# Patient Record
Sex: Female | Born: 1955 | Race: Black or African American | Hispanic: No | Marital: Married | Smoking: Former smoker
Health system: Southern US, Community
[De-identification: ages and names within clinical notes are randomized; demographics above are authoritative.]

## PROBLEM LIST (undated history)

## (undated) DIAGNOSIS — J45909 Unspecified asthma, uncomplicated: Secondary | ICD-10-CM

## (undated) DIAGNOSIS — I10 Essential (primary) hypertension: Secondary | ICD-10-CM

---

## 1998-10-30 ENCOUNTER — Other Ambulatory Visit: Admission: RE | Admit: 1998-10-30 | Discharge: 1998-10-30 | Payer: Self-pay | Admitting: Obstetrics

## 1999-02-28 ENCOUNTER — Encounter: Payer: Self-pay | Admitting: Emergency Medicine

## 1999-02-28 ENCOUNTER — Emergency Department (HOSPITAL_COMMUNITY): Admission: EM | Admit: 1999-02-28 | Discharge: 1999-02-28 | Payer: Self-pay | Admitting: Emergency Medicine

## 1999-04-12 ENCOUNTER — Encounter: Payer: Self-pay | Admitting: Emergency Medicine

## 1999-04-12 ENCOUNTER — Emergency Department (HOSPITAL_COMMUNITY): Admission: EM | Admit: 1999-04-12 | Discharge: 1999-04-12 | Payer: Self-pay | Admitting: Emergency Medicine

## 1999-04-30 ENCOUNTER — Emergency Department (HOSPITAL_COMMUNITY): Admission: EM | Admit: 1999-04-30 | Discharge: 1999-04-30 | Payer: Self-pay | Admitting: Emergency Medicine

## 1999-04-30 ENCOUNTER — Encounter: Payer: Self-pay | Admitting: Emergency Medicine

## 2000-10-04 ENCOUNTER — Emergency Department (HOSPITAL_COMMUNITY): Admission: EM | Admit: 2000-10-04 | Discharge: 2000-10-05 | Payer: Self-pay | Admitting: Emergency Medicine

## 2001-01-28 ENCOUNTER — Encounter: Payer: Self-pay | Admitting: Obstetrics

## 2001-01-28 ENCOUNTER — Ambulatory Visit (HOSPITAL_COMMUNITY): Admission: RE | Admit: 2001-01-28 | Discharge: 2001-01-28 | Payer: Self-pay | Admitting: Obstetrics

## 2001-05-25 ENCOUNTER — Emergency Department (HOSPITAL_COMMUNITY): Admission: EM | Admit: 2001-05-25 | Discharge: 2001-05-25 | Payer: Self-pay | Admitting: Emergency Medicine

## 2002-11-03 ENCOUNTER — Encounter: Payer: Self-pay | Admitting: Obstetrics

## 2002-11-03 ENCOUNTER — Ambulatory Visit (HOSPITAL_COMMUNITY): Admission: RE | Admit: 2002-11-03 | Discharge: 2002-11-03 | Payer: Self-pay | Admitting: Obstetrics

## 2004-03-29 ENCOUNTER — Ambulatory Visit (HOSPITAL_COMMUNITY): Admission: RE | Admit: 2004-03-29 | Discharge: 2004-03-29 | Payer: Self-pay | Admitting: Obstetrics

## 2005-06-24 ENCOUNTER — Ambulatory Visit (HOSPITAL_COMMUNITY): Admission: RE | Admit: 2005-06-24 | Discharge: 2005-06-24 | Payer: Self-pay | Admitting: Obstetrics

## 2006-07-29 ENCOUNTER — Ambulatory Visit (HOSPITAL_COMMUNITY): Admission: RE | Admit: 2006-07-29 | Discharge: 2006-07-29 | Payer: Self-pay | Admitting: Obstetrics

## 2006-08-14 ENCOUNTER — Encounter: Admission: RE | Admit: 2006-08-14 | Discharge: 2006-08-14 | Payer: Self-pay | Admitting: Obstetrics

## 2007-05-17 ENCOUNTER — Encounter: Admission: RE | Admit: 2007-05-17 | Discharge: 2007-05-17 | Payer: Self-pay | Admitting: Obstetrics

## 2007-12-16 ENCOUNTER — Ambulatory Visit (HOSPITAL_BASED_OUTPATIENT_CLINIC_OR_DEPARTMENT_OTHER): Admission: RE | Admit: 2007-12-16 | Discharge: 2007-12-16 | Payer: Self-pay | Admitting: Family Medicine

## 2008-09-10 ENCOUNTER — Emergency Department (HOSPITAL_BASED_OUTPATIENT_CLINIC_OR_DEPARTMENT_OTHER): Admission: EM | Admit: 2008-09-10 | Discharge: 2008-09-10 | Payer: Self-pay | Admitting: Emergency Medicine

## 2009-05-23 ENCOUNTER — Ambulatory Visit (HOSPITAL_COMMUNITY): Admission: RE | Admit: 2009-05-23 | Discharge: 2009-05-23 | Payer: Self-pay | Admitting: Obstetrics

## 2010-06-30 ENCOUNTER — Encounter: Payer: Self-pay | Admitting: Obstetrics

## 2010-07-03 ENCOUNTER — Emergency Department (HOSPITAL_BASED_OUTPATIENT_CLINIC_OR_DEPARTMENT_OTHER)
Admission: EM | Admit: 2010-07-03 | Discharge: 2010-07-03 | Payer: Self-pay | Source: Home / Self Care | Admitting: Emergency Medicine

## 2011-03-05 ENCOUNTER — Other Ambulatory Visit (HOSPITAL_COMMUNITY): Payer: Self-pay | Admitting: Family Medicine

## 2011-03-05 DIAGNOSIS — Z1231 Encounter for screening mammogram for malignant neoplasm of breast: Secondary | ICD-10-CM

## 2011-03-13 ENCOUNTER — Ambulatory Visit (HOSPITAL_COMMUNITY): Payer: PRIVATE HEALTH INSURANCE | Attending: Family Medicine

## 2011-09-24 ENCOUNTER — Ambulatory Visit (HOSPITAL_COMMUNITY)
Admission: RE | Admit: 2011-09-24 | Discharge: 2011-09-24 | Disposition: A | Discharge status: Home / Self Care | Payer: PRIVATE HEALTH INSURANCE | Source: Ambulatory Visit | Attending: Family Medicine | Admitting: Family Medicine

## 2011-09-24 DIAGNOSIS — Z1231 Encounter for screening mammogram for malignant neoplasm of breast: Secondary | ICD-10-CM

## 2012-07-31 ENCOUNTER — Encounter (HOSPITAL_BASED_OUTPATIENT_CLINIC_OR_DEPARTMENT_OTHER): Payer: Self-pay | Admitting: *Deleted

## 2012-07-31 ENCOUNTER — Emergency Department (HOSPITAL_BASED_OUTPATIENT_CLINIC_OR_DEPARTMENT_OTHER)
Admission: EM | Admit: 2012-07-31 | Discharge: 2012-07-31 | Disposition: A | Discharge status: Home / Self Care | Payer: Self-pay | Attending: Emergency Medicine | Admitting: Emergency Medicine

## 2012-07-31 DIAGNOSIS — J45901 Unspecified asthma with (acute) exacerbation: Secondary | ICD-10-CM | POA: Insufficient documentation

## 2012-07-31 DIAGNOSIS — R1110 Vomiting, unspecified: Secondary | ICD-10-CM | POA: Insufficient documentation

## 2012-07-31 DIAGNOSIS — I10 Essential (primary) hypertension: Secondary | ICD-10-CM | POA: Insufficient documentation

## 2012-07-31 DIAGNOSIS — Z79899 Other long term (current) drug therapy: Secondary | ICD-10-CM | POA: Insufficient documentation

## 2012-07-31 DIAGNOSIS — Z87891 Personal history of nicotine dependence: Secondary | ICD-10-CM | POA: Insufficient documentation

## 2012-07-31 DIAGNOSIS — R059 Cough, unspecified: Secondary | ICD-10-CM | POA: Insufficient documentation

## 2012-07-31 HISTORY — DX: Unspecified asthma, uncomplicated: J45.909

## 2012-07-31 HISTORY — DX: Essential (primary) hypertension: I10

## 2012-07-31 MED ORDER — IPRATROPIUM BROMIDE 0.02 % IN SOLN
RESPIRATORY_TRACT | Status: AC
  Filled 2012-07-31: qty 2.5

## 2012-07-31 MED ORDER — METHYLPREDNISOLONE SODIUM SUCC 125 MG IJ SOLR
125.0000 mg | Freq: Once | INTRAMUSCULAR | Status: AC
  Administered 2012-07-31: 125 mg via INTRAVENOUS
  Filled 2012-07-31: qty 2

## 2012-07-31 MED ORDER — ALBUTEROL SULFATE (5 MG/ML) 0.5% IN NEBU
5.0000 mg | INHALATION_SOLUTION | Freq: Once | RESPIRATORY_TRACT | Status: AC
  Administered 2012-07-31: 5 mg via RESPIRATORY_TRACT

## 2012-07-31 MED ORDER — ALBUTEROL SULFATE HFA 108 (90 BASE) MCG/ACT IN AERS
2.0000 | INHALATION_SPRAY | Freq: Once | RESPIRATORY_TRACT | Status: AC
  Administered 2012-07-31: 2 via RESPIRATORY_TRACT
  Filled 2012-07-31: qty 6.7

## 2012-07-31 MED ORDER — ALBUTEROL SULFATE (5 MG/ML) 0.5% IN NEBU
INHALATION_SOLUTION | RESPIRATORY_TRACT | Status: AC
  Filled 2012-07-31: qty 1

## 2012-07-31 MED ORDER — IPRATROPIUM BROMIDE 0.02 % IN SOLN
0.5000 mg | Freq: Once | RESPIRATORY_TRACT | Status: AC
  Administered 2012-07-31: 0.5 mg via RESPIRATORY_TRACT

## 2012-07-31 NOTE — ED Provider Notes (Signed)
History     CSN: [id number]  Arrival date & time 07/31/12  0201   First MD Initiated Contact with Patient 07/31/12 0244      Chief Complaint  Patient presents with  . Asthma    (Consider location/radiation/quality/duration/timing/severity/associated sxs/prior treatment) HPI This is a 56 year old female with a history of asthma. She states she cleaned out her shower with Clorox 2 days ago and this triggered her asthma. Her shortness of breath worsened and subsequently into a became severe this morning. It was associated with wheezing, cough and posttussive emesis. She denies fever. States she could not find her albuterol inhaler despite searching for house for her. She was given albuterol and Atrovent treatment per protocol on arrival with significant improvement in her symptomatology. She states she feels back to baseline at this time.  Past Medical History  Diagnosis Date  . Asthma   . Hypertension     Past Surgical History  Procedure Laterality Date  . Cesarean section      No family history on file.  History  Substance Use Topics  . Smoking status: Former Games developer  . Smokeless tobacco: Never Used  . Alcohol Use: Not on file    OB History   Grav Para Term Preterm Abortions TAB SAB Ect Mult Living                  Review of Systems  All other systems reviewed and are negative.    Allergies  Penicillins  Home Medications   Current Outpatient Rx  Name  Route  Sig  Dispense  Refill  . albuterol (PROVENTIL HFA;VENTOLIN HFA) 108 (90 BASE) MCG/ACT inhaler   Inhalation   Inhale 2 puffs into the lungs every 6 (six) hours as needed for wheezing.           BP 139/96  Pulse 90  Temp(Src) 98.1 F (36.7 C)  SpO2 95%  Physical Exam General: Well-developed, well-nourished female in no acute distress; appearance consistent with age of record HENT: normocephalic, atraumatic Eyes: pupils equal round and reactive to light; extraocular muscles intact Neck:  supple Heart: regular rate and rhythm Lungs: clear to auscultation bilaterally Abdomen: soft; nondistended Extremities: No deformity; full range of motion Neurologic: Awake, alert and oriented; motor function intact in all extremities and symmetric; no facial droop Skin: Warm and dry Psychiatric: Normal mood and affect    ED Course  Procedures (including critical care time)     MDM  3:39 AM Lungs still clear. We will provide a replacement inhaler.        Hanley Seamen, MD 07/31/12 [phone]

## 2012-07-31 NOTE — Patient Instructions (Signed)
Instructed pt on the proper use of administering albuteral mdi via aerochamber pt tolerated well 

## 2012-07-31 NOTE — ED Notes (Signed)
C/o sob since Thursday- hx of asthma

## 2013-08-18 ENCOUNTER — Other Ambulatory Visit (HOSPITAL_COMMUNITY): Payer: Self-pay | Admitting: Family Medicine

## 2013-08-18 DIAGNOSIS — Z1231 Encounter for screening mammogram for malignant neoplasm of breast: Secondary | ICD-10-CM

## 2013-08-30 ENCOUNTER — Ambulatory Visit (HOSPITAL_COMMUNITY): Payer: PRIVATE HEALTH INSURANCE

## 2013-09-06 ENCOUNTER — Ambulatory Visit (HOSPITAL_COMMUNITY)
Admission: RE | Admit: 2013-09-06 | Discharge: 2013-09-06 | Disposition: A | Discharge status: Home / Self Care | Payer: PRIVATE HEALTH INSURANCE | Source: Ambulatory Visit | Attending: Family Medicine | Admitting: Family Medicine

## 2013-09-06 DIAGNOSIS — Z1231 Encounter for screening mammogram for malignant neoplasm of breast: Secondary | ICD-10-CM | POA: Insufficient documentation

## 2015-09-03 LAB — PROCEDURE REPORT - SCANNED: PAP SMEAR: NEGATIVE

## 2015-09-18 ENCOUNTER — Other Ambulatory Visit: Payer: Self-pay

## 2015-09-18 DIAGNOSIS — Z1231 Encounter for screening mammogram for malignant neoplasm of breast: Secondary | ICD-10-CM

## 2015-10-05 ENCOUNTER — Ambulatory Visit: Payer: PRIVATE HEALTH INSURANCE

## 2015-10-25 ENCOUNTER — Ambulatory Visit: Payer: PRIVATE HEALTH INSURANCE

## 2015-11-02 ENCOUNTER — Ambulatory Visit: Payer: PRIVATE HEALTH INSURANCE

## 2015-11-14 ENCOUNTER — Ambulatory Visit: Payer: PRIVATE HEALTH INSURANCE

## 2015-11-23 ENCOUNTER — Ambulatory Visit
Admission: RE | Admit: 2015-11-23 | Discharge: 2015-11-23 | Disposition: A | Discharge status: Home / Self Care | Payer: PRIVATE HEALTH INSURANCE | Source: Ambulatory Visit

## 2015-11-23 DIAGNOSIS — Z1231 Encounter for screening mammogram for malignant neoplasm of breast: Secondary | ICD-10-CM

## 2016-03-02 ENCOUNTER — Encounter: Payer: Self-pay | Admitting: *Deleted

## 2017-03-13 ENCOUNTER — Other Ambulatory Visit: Payer: Self-pay | Admitting: Family Medicine

## 2017-03-13 ENCOUNTER — Ambulatory Visit
Admission: RE | Admit: 2017-03-13 | Discharge: 2017-03-13 | Disposition: A | Discharge status: Home / Self Care | Payer: PRIVATE HEALTH INSURANCE | Source: Ambulatory Visit | Attending: Family Medicine | Admitting: Family Medicine

## 2017-03-13 DIAGNOSIS — M130 Polyarthritis, unspecified: Secondary | ICD-10-CM

## 2017-09-14 DIAGNOSIS — Z87891 Personal history of nicotine dependence: Secondary | ICD-10-CM | POA: Insufficient documentation

## 2017-09-14 DIAGNOSIS — R0602 Shortness of breath: Secondary | ICD-10-CM | POA: Diagnosis present

## 2017-09-14 DIAGNOSIS — J4521 Mild intermittent asthma with (acute) exacerbation: Secondary | ICD-10-CM | POA: Diagnosis not present

## 2017-09-14 DIAGNOSIS — I10 Essential (primary) hypertension: Secondary | ICD-10-CM | POA: Insufficient documentation

## 2017-09-14 DIAGNOSIS — Z79899 Other long term (current) drug therapy: Secondary | ICD-10-CM | POA: Insufficient documentation

## 2017-09-15 ENCOUNTER — Other Ambulatory Visit: Payer: Self-pay

## 2017-09-15 ENCOUNTER — Encounter (HOSPITAL_BASED_OUTPATIENT_CLINIC_OR_DEPARTMENT_OTHER): Payer: Self-pay | Admitting: *Deleted

## 2017-09-15 ENCOUNTER — Emergency Department (HOSPITAL_BASED_OUTPATIENT_CLINIC_OR_DEPARTMENT_OTHER): Payer: PRIVATE HEALTH INSURANCE

## 2017-09-15 ENCOUNTER — Emergency Department (HOSPITAL_BASED_OUTPATIENT_CLINIC_OR_DEPARTMENT_OTHER)
Admission: EM | Admit: 2017-09-15 | Discharge: 2017-09-15 | Disposition: A | Discharge status: Home / Self Care | Payer: PRIVATE HEALTH INSURANCE | Attending: Emergency Medicine | Admitting: Emergency Medicine

## 2017-09-15 DIAGNOSIS — J4521 Mild intermittent asthma with (acute) exacerbation: Secondary | ICD-10-CM

## 2017-09-15 MED ORDER — PREDNISONE 20 MG PO TABS: 40.0000 mg | ORAL_TABLET | Freq: Every day | ORAL | 0 refills | Status: AC

## 2017-09-15 MED ORDER — ALBUTEROL SULFATE HFA 108 (90 BASE) MCG/ACT IN AERS: 2.0000 | INHALATION_SPRAY | RESPIRATORY_TRACT | 2 refills | Status: AC | PRN

## 2017-09-15 MED ORDER — IPRATROPIUM-ALBUTEROL 0.5-2.5 (3) MG/3ML IN SOLN
RESPIRATORY_TRACT | Status: AC
  Administered 2017-09-15: 3 mL via RESPIRATORY_TRACT
  Filled 2017-09-15: qty 3

## 2017-09-15 MED ORDER — ALBUTEROL (5 MG/ML) CONTINUOUS INHALATION SOLN
10.0000 mg/h | INHALATION_SOLUTION | RESPIRATORY_TRACT | Status: DC
  Administered 2017-09-15: 10 mg/h via RESPIRATORY_TRACT
  Filled 2017-09-15: qty 20

## 2017-09-15 MED ORDER — ALBUTEROL SULFATE HFA 108 (90 BASE) MCG/ACT IN AERS
2.0000 | INHALATION_SPRAY | RESPIRATORY_TRACT | Status: DC | PRN
  Administered 2017-09-15: 2 via RESPIRATORY_TRACT
  Filled 2017-09-15: qty 6.7

## 2017-09-15 MED ORDER — ALBUTEROL SULFATE (2.5 MG/3ML) 0.083% IN NEBU
INHALATION_SOLUTION | RESPIRATORY_TRACT | Status: AC
  Administered 2017-09-15: 2.5 mg via RESPIRATORY_TRACT
  Filled 2017-09-15: qty 3

## 2017-09-15 MED ORDER — IPRATROPIUM-ALBUTEROL 0.5-2.5 (3) MG/3ML IN SOLN
3.0000 mL | Freq: Once | RESPIRATORY_TRACT | Status: AC
  Administered 2017-09-15: 3 mL via RESPIRATORY_TRACT

## 2017-09-15 MED ORDER — ALBUTEROL SULFATE (2.5 MG/3ML) 0.083% IN NEBU
2.5000 mg | INHALATION_SOLUTION | Freq: Once | RESPIRATORY_TRACT | Status: AC
  Administered 2017-09-15: 2.5 mg via RESPIRATORY_TRACT

## 2017-09-15 MED ORDER — PREDNISONE 50 MG PO TABS
60.0000 mg | ORAL_TABLET | Freq: Once | ORAL | Status: AC
  Administered 2017-09-15: 02:00:00 60 mg via ORAL
  Filled 2017-09-15: qty 1

## 2017-09-15 NOTE — ED Notes (Signed)
Family at bedside. 

## 2017-09-15 NOTE — ED Triage Notes (Signed)
Pt reports onset of SOB tonight. She states she is out of her inhalers at home. Denies fevers.

## 2017-09-15 NOTE — ED Provider Notes (Signed)
[facility] EMERGENCY DEPARTMENT Provider Note   CSN: [id number] Arrival date & time: 09/14/17  2358     History   Chief Complaint Chief Complaint  Patient presents with  . Shortness of Breath    HPI Chelsea Garza is a 62 y.o. female.  Patient presents to the emergency department for evaluation of difficulty breathing.  Patient has a history of asthma.  She reports that she has been feeling some wheezing and shortness of breath the last few days with the weather change in pollen.  Tonight, however, she had acute onset of wheezing.  She did not have any albuterol at home so she presented for evaluation.  Patient denies any fever, cough, chest congestion, chest pain.     Past Medical History:  Diagnosis Date  . Asthma   . Hypertension     There are no active problems to display for this patient.   Past Surgical History:  Procedure Laterality Date  . CESAREAN SECTION       OB History   None      Home Medications    Prior to Admission medications   Medication Sig Start Date End Date Taking? Authorizing Provider  albuterol (PROVENTIL HFA;VENTOLIN HFA) 108 (90 BASE) MCG/ACT inhaler Inhale 2 puffs into the lungs every 6 (six) hours as needed for wheezing.   Yes [provider]  budesonide-formoterol (SYMBICORT) 160-4.5 MCG/ACT inhaler Inhale 2 puffs into the lungs 2 (two) times daily.   Yes [provider]  amLODipine (NORVASC) 10 MG tablet  08/22/17   [provider]  cyclobenzaprine (FLEXERIL) 10 MG tablet  09/10/17   [provider]    Family History No family history on file.  Social History Social History   Tobacco Use  . Smoking status: Former Games developermoker  . Smokeless tobacco: Never Used  Substance Use Topics  . Alcohol use: Not on file  . Drug use: No     Allergies   Penicillins   Review of Systems Review of Systems  Respiratory: Positive for shortness of breath and wheezing.   All other systems reviewed  and are negative.    Physical Exam Updated Vital Signs BP 128/84   Pulse 94   Temp 98.1 F (36.7 C) (Oral)   Resp (!) 21   Ht 5\' 7"  (1.702 m)   Wt 99.3 kg (219 lb)   SpO2 96%   BMI 34.30 kg/m   Physical Exam  Constitutional: She is oriented to person, place, and time. She appears well-developed and well-nourished. No distress.  HENT:  Head: Normocephalic and atraumatic.  Right Ear: Hearing normal.  Left Ear: Hearing normal.  Nose: Nose normal.  Mouth/Throat: Oropharynx is clear and moist and mucous membranes are normal.  Eyes: Pupils are equal, round, and reactive to light. Conjunctivae and EOM are normal.  Neck: Normal range of motion. Neck supple.  Cardiovascular: Regular rhythm, S1 normal and S2 normal. Exam reveals no gallop and no friction rub.  No murmur heard. Pulmonary/Chest: Effort normal. No respiratory distress. She has decreased breath sounds. She has wheezes. She exhibits no tenderness.  Abdominal: Soft. Normal appearance and bowel sounds are normal. There is no hepatosplenomegaly. There is no tenderness. There is no rebound, no guarding, no tenderness at McBurney's point and negative Murphy's sign. No hernia.  Musculoskeletal: Normal range of motion.  Neurological: She is alert and oriented to person, place, and time. She has normal strength. No cranial nerve deficit or sensory deficit. Coordination normal. GCS eye subscore  is 4. GCS verbal subscore is 5. GCS motor subscore is 6.  Skin: Skin is warm, dry and intact. No rash noted. No cyanosis.  Psychiatric: She has a normal mood and affect. Her speech is normal and behavior is normal. Thought content normal.  Nursing note and vitals reviewed.    ED Treatments / Results  Labs (all labs ordered are listed, but only abnormal results are displayed) Labs Reviewed - No data to display  EKG None  Radiology No results found.  Procedures Procedures (including critical care time)  Medications Ordered in  ED Medications  albuterol (PROVENTIL,VENTOLIN) solution continuous neb (0 mg/hr Nebulization Stopped 09/15/17 0124)  albuterol (PROVENTIL HFA;VENTOLIN HFA) 108 (90 Base) MCG/ACT inhaler 2 puff (has no administration in time range)  predniSONE (DELTASONE) tablet 60 mg (has no administration in time range)  ipratropium-albuterol (DUONEB) 0.5-2.5 (3) MG/3ML nebulizer solution 3 mL (3 mLs Nebulization Given 09/15/17 0023)  albuterol (PROVENTIL) (2.5 MG/3ML) 0.083% nebulizer solution 2.5 mg (2.5 mg Nebulization Given 09/15/17 0023)     Initial Impression / Assessment and Plan / ED Course  I have reviewed the triage vital signs and the nursing notes.  Pertinent labs & imaging results that were available during my care of the patient were reviewed by me and considered in my medical decision making (see chart for details).     Patient with previous history of asthma presents to the emergency department for evaluation of wheezing and shortness of breath.  Symptoms began this evening.  She did not have an albuterol rescue inhaler to use at home.  Patient had moderate bronchospasm on arrival to the ER.  She has been treated with albuterol and Atrovent and has had complete resolution.  Oxygenation is 96% on room air, she is moving air well without any further wheezing.  Respiratory rate is normal.  She has had significant improvement and is appropriate for continued outpatient management.  Final Clinical Impressions(s) / ED Diagnoses   Final diagnoses:  Mild intermittent asthma with exacerbation    ED Discharge Orders    None       Jeriyah Granlund, Canary Brim, MD 09/15/17 0128

## 2017-09-15 NOTE — ED Notes (Signed)
Pt. Stated she was using her vacuum tonight and her back got really tight and her chest got real tight.  Pt. Said she could not stand up straight and she could not take a deep breath.  Pt. Came to ED.

## 2018-08-18 ENCOUNTER — Other Ambulatory Visit (HOSPITAL_COMMUNITY)
Admission: RE | Admit: 2018-08-18 | Discharge: 2018-08-18 | Disposition: A | Discharge status: Home / Self Care | Payer: PRIVATE HEALTH INSURANCE | Source: Ambulatory Visit | Attending: Nurse Practitioner | Admitting: Nurse Practitioner

## 2018-08-18 DIAGNOSIS — Z124 Encounter for screening for malignant neoplasm of cervix: Secondary | ICD-10-CM | POA: Insufficient documentation

## 2018-08-19 ENCOUNTER — Other Ambulatory Visit: Payer: Self-pay

## 2018-08-24 LAB — CYTOLOGY - PAP
Diagnosis: NEGATIVE
HPV (WINDOPATH): NOT DETECTED

## 2019-02-13 IMAGING — DX DG CHEST 1V PORT
1 series · 1 of 1 positions shown · non-contrast
Comparison: None.

CLINICAL DATA: Shortness of breath.

EXAM:
PORTABLE CHEST 1 VIEW

[chest ap]
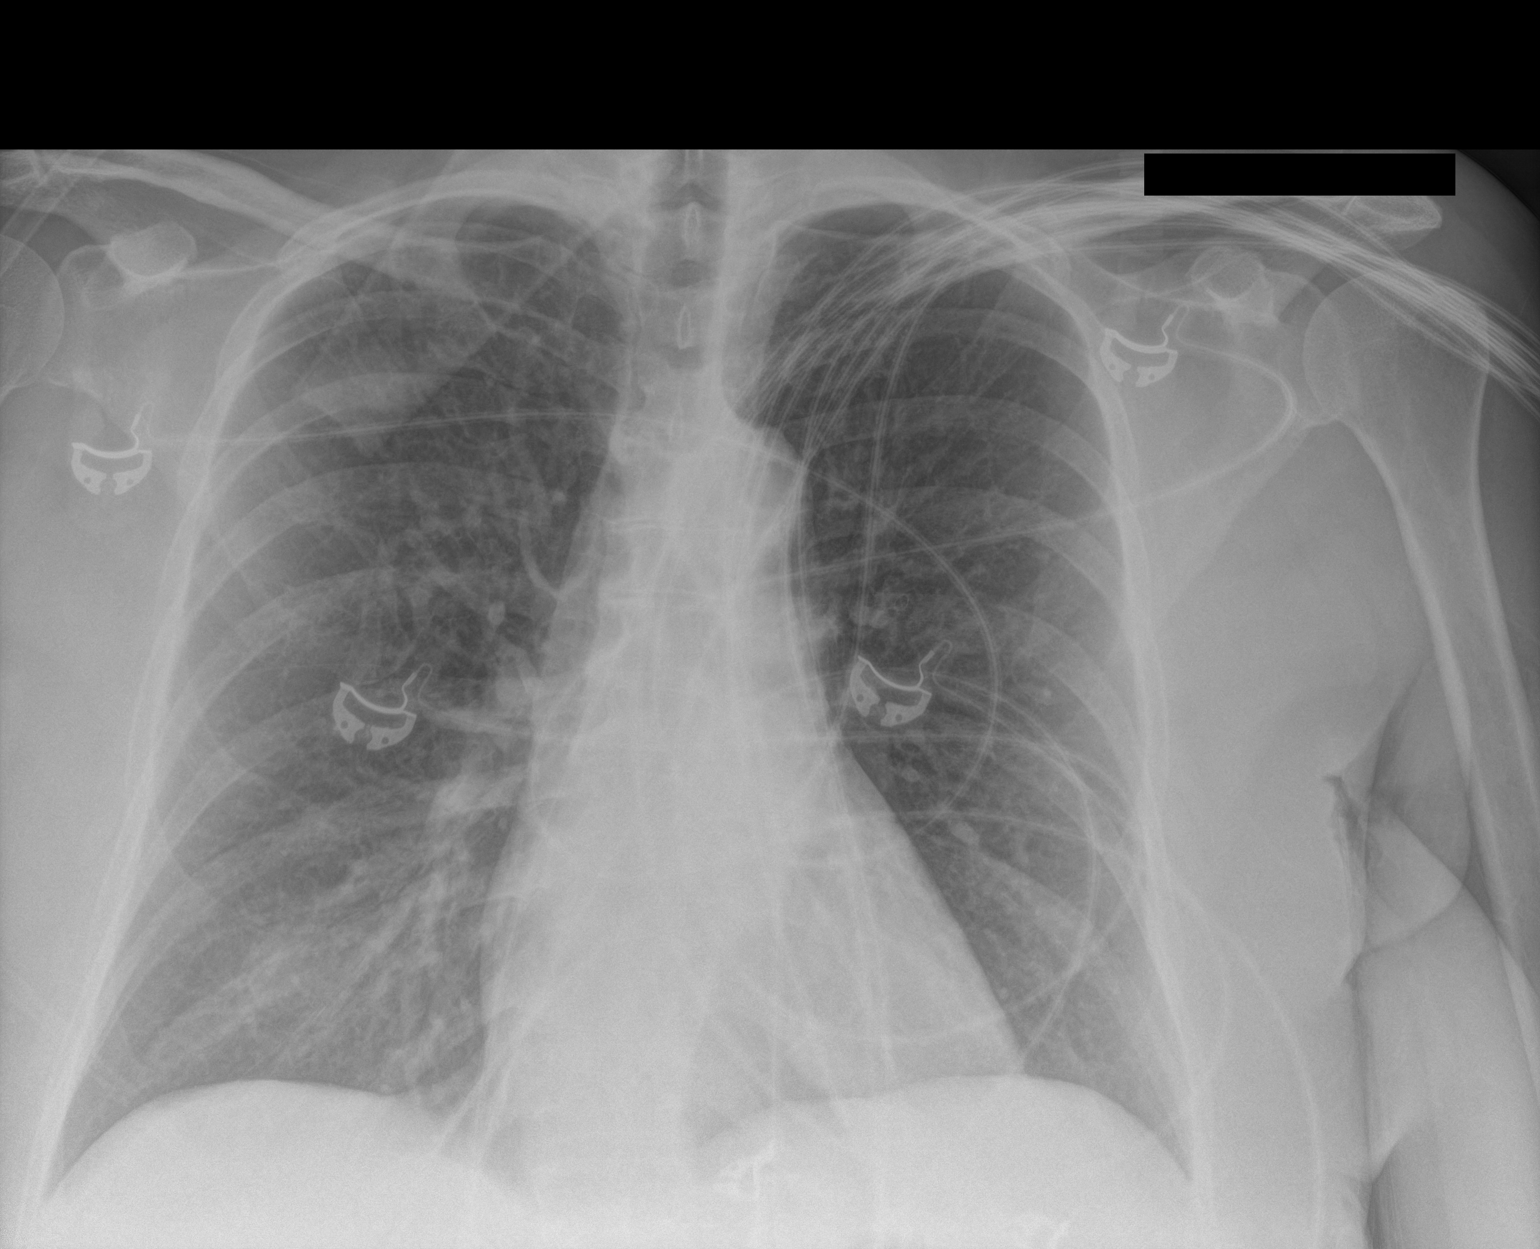

[1 of 1 positions shown; findings below may reference images not displayed]

FINDINGS: The cardiomediastinal contours are normal. Borderline hyperinflation
and bronchial thickening. Mild right lung base atelectasis.
Pulmonary vasculature is normal. No consolidation, pleural effusion,
or pneumothorax. No acute osseous abnormalities are seen.
IMPRESSION: Borderline hyperinflation and bronchial thickening, which can be
seen with asthma. Mild right lung base atelectasis.

## 2019-03-15 DIAGNOSIS — E782 Mixed hyperlipidemia: Secondary | ICD-10-CM | POA: Diagnosis not present

## 2019-03-15 DIAGNOSIS — F5102 Adjustment insomnia: Secondary | ICD-10-CM | POA: Diagnosis not present

## 2019-03-15 DIAGNOSIS — G5693 Unspecified mononeuropathy of bilateral upper limbs: Secondary | ICD-10-CM | POA: Diagnosis not present

## 2019-03-15 DIAGNOSIS — R7309 Other abnormal glucose: Secondary | ICD-10-CM | POA: Diagnosis not present

## 2019-03-15 DIAGNOSIS — I10 Essential (primary) hypertension: Secondary | ICD-10-CM | POA: Diagnosis not present

## 2019-03-15 DIAGNOSIS — M130 Polyarthritis, unspecified: Secondary | ICD-10-CM | POA: Diagnosis not present

## 2019-03-15 DIAGNOSIS — J441 Chronic obstructive pulmonary disease with (acute) exacerbation: Secondary | ICD-10-CM | POA: Diagnosis not present

## 2019-04-19 DIAGNOSIS — M130 Polyarthritis, unspecified: Secondary | ICD-10-CM | POA: Diagnosis not present

## 2019-04-21 DIAGNOSIS — M545 Low back pain: Secondary | ICD-10-CM | POA: Diagnosis not present

## 2019-04-21 DIAGNOSIS — M25551 Pain in right hip: Secondary | ICD-10-CM | POA: Diagnosis not present

## 2019-04-21 DIAGNOSIS — M79642 Pain in left hand: Secondary | ICD-10-CM | POA: Diagnosis not present

## 2019-09-27 DIAGNOSIS — E669 Obesity, unspecified: Secondary | ICD-10-CM | POA: Diagnosis not present

## 2019-09-27 DIAGNOSIS — J441 Chronic obstructive pulmonary disease with (acute) exacerbation: Secondary | ICD-10-CM | POA: Diagnosis not present

## 2019-09-27 DIAGNOSIS — R7303 Prediabetes: Secondary | ICD-10-CM | POA: Diagnosis not present

## 2019-09-27 DIAGNOSIS — I10 Essential (primary) hypertension: Secondary | ICD-10-CM | POA: Diagnosis not present

## 2019-09-27 DIAGNOSIS — E782 Mixed hyperlipidemia: Secondary | ICD-10-CM | POA: Diagnosis not present

## 2019-10-20 DIAGNOSIS — B351 Tinea unguium: Secondary | ICD-10-CM | POA: Diagnosis not present

## 2019-10-20 DIAGNOSIS — L718 Other rosacea: Secondary | ICD-10-CM | POA: Diagnosis not present

## 2019-10-20 DIAGNOSIS — L308 Other specified dermatitis: Secondary | ICD-10-CM | POA: Diagnosis not present

## 2019-11-03 DIAGNOSIS — Z780 Asymptomatic menopausal state: Secondary | ICD-10-CM | POA: Diagnosis not present

## 2019-11-03 DIAGNOSIS — Z1231 Encounter for screening mammogram for malignant neoplasm of breast: Secondary | ICD-10-CM | POA: Diagnosis not present

## 2019-12-07 DIAGNOSIS — E7849 Other hyperlipidemia: Secondary | ICD-10-CM | POA: Diagnosis not present

## 2019-12-07 DIAGNOSIS — Z720 Tobacco use: Secondary | ICD-10-CM | POA: Diagnosis not present

## 2019-12-07 DIAGNOSIS — I10 Essential (primary) hypertension: Secondary | ICD-10-CM | POA: Diagnosis not present

## 2019-12-07 DIAGNOSIS — J441 Chronic obstructive pulmonary disease with (acute) exacerbation: Secondary | ICD-10-CM | POA: Diagnosis not present

## 2019-12-27 DIAGNOSIS — R10816 Epigastric abdominal tenderness: Secondary | ICD-10-CM | POA: Diagnosis not present

## 2019-12-27 DIAGNOSIS — E6609 Other obesity due to excess calories: Secondary | ICD-10-CM | POA: Diagnosis not present

## 2019-12-27 DIAGNOSIS — I10 Essential (primary) hypertension: Secondary | ICD-10-CM | POA: Diagnosis not present

## 2019-12-27 DIAGNOSIS — R7303 Prediabetes: Secondary | ICD-10-CM | POA: Diagnosis not present

## 2019-12-27 DIAGNOSIS — E782 Mixed hyperlipidemia: Secondary | ICD-10-CM | POA: Diagnosis not present

## 2019-12-27 DIAGNOSIS — R110 Nausea: Secondary | ICD-10-CM | POA: Diagnosis not present

## 2020-02-07 DIAGNOSIS — E7849 Other hyperlipidemia: Secondary | ICD-10-CM | POA: Diagnosis not present

## 2020-02-07 DIAGNOSIS — Z720 Tobacco use: Secondary | ICD-10-CM | POA: Diagnosis not present

## 2020-02-07 DIAGNOSIS — I10 Essential (primary) hypertension: Secondary | ICD-10-CM | POA: Diagnosis not present

## 2020-02-07 DIAGNOSIS — J441 Chronic obstructive pulmonary disease with (acute) exacerbation: Secondary | ICD-10-CM | POA: Diagnosis not present

## 2020-05-08 DIAGNOSIS — Z720 Tobacco use: Secondary | ICD-10-CM | POA: Diagnosis not present

## 2020-05-08 DIAGNOSIS — E7849 Other hyperlipidemia: Secondary | ICD-10-CM | POA: Diagnosis not present

## 2020-05-08 DIAGNOSIS — I10 Essential (primary) hypertension: Secondary | ICD-10-CM | POA: Diagnosis not present

## 2020-05-08 DIAGNOSIS — J441 Chronic obstructive pulmonary disease with (acute) exacerbation: Secondary | ICD-10-CM | POA: Diagnosis not present

## 2020-05-10 DIAGNOSIS — S62647A Nondisplaced fracture of proximal phalanx of left little finger, initial encounter for closed fracture: Secondary | ICD-10-CM | POA: Diagnosis not present

## 2020-05-17 DIAGNOSIS — M79645 Pain in left finger(s): Secondary | ICD-10-CM | POA: Diagnosis not present

## 2020-05-17 DIAGNOSIS — S62617A Displaced fracture of proximal phalanx of left little finger, initial encounter for closed fracture: Secondary | ICD-10-CM | POA: Diagnosis not present

## 2020-06-21 DIAGNOSIS — M79645 Pain in left finger(s): Secondary | ICD-10-CM | POA: Diagnosis not present

## 2020-06-21 DIAGNOSIS — S62617A Displaced fracture of proximal phalanx of left little finger, initial encounter for closed fracture: Secondary | ICD-10-CM | POA: Diagnosis not present

## 2020-07-04 DIAGNOSIS — S62647S Nondisplaced fracture of proximal phalanx of left little finger, sequela: Secondary | ICD-10-CM | POA: Diagnosis not present

## 2020-07-04 DIAGNOSIS — M79645 Pain in left finger(s): Secondary | ICD-10-CM | POA: Diagnosis not present

## 2020-07-16 DIAGNOSIS — E782 Mixed hyperlipidemia: Secondary | ICD-10-CM | POA: Diagnosis not present

## 2020-07-16 DIAGNOSIS — J449 Chronic obstructive pulmonary disease, unspecified: Secondary | ICD-10-CM | POA: Diagnosis not present

## 2020-07-16 DIAGNOSIS — I10 Essential (primary) hypertension: Secondary | ICD-10-CM | POA: Diagnosis not present

## 2020-07-16 DIAGNOSIS — R7303 Prediabetes: Secondary | ICD-10-CM | POA: Diagnosis not present

## 2020-12-18 DIAGNOSIS — I110 Hypertensive heart disease with heart failure: Secondary | ICD-10-CM | POA: Diagnosis not present

## 2020-12-18 DIAGNOSIS — E1169 Type 2 diabetes mellitus with other specified complication: Secondary | ICD-10-CM | POA: Diagnosis not present

## 2020-12-18 DIAGNOSIS — R7309 Other abnormal glucose: Secondary | ICD-10-CM | POA: Diagnosis not present

## 2020-12-18 DIAGNOSIS — J449 Chronic obstructive pulmonary disease, unspecified: Secondary | ICD-10-CM | POA: Diagnosis not present

## 2020-12-18 DIAGNOSIS — F1010 Alcohol abuse, uncomplicated: Secondary | ICD-10-CM | POA: Diagnosis not present

## 2020-12-18 DIAGNOSIS — E782 Mixed hyperlipidemia: Secondary | ICD-10-CM | POA: Diagnosis not present

## 2021-01-24 DIAGNOSIS — Z1211 Encounter for screening for malignant neoplasm of colon: Secondary | ICD-10-CM | POA: Diagnosis not present

## 2021-01-24 DIAGNOSIS — R1032 Left lower quadrant pain: Secondary | ICD-10-CM | POA: Diagnosis not present

## 2021-01-24 DIAGNOSIS — Z86010 Personal history of colonic polyps: Secondary | ICD-10-CM | POA: Diagnosis not present

## 2021-01-24 DIAGNOSIS — E669 Obesity, unspecified: Secondary | ICD-10-CM | POA: Diagnosis not present

## 2021-04-30 DIAGNOSIS — R079 Chest pain, unspecified: Secondary | ICD-10-CM | POA: Diagnosis not present

## 2021-04-30 DIAGNOSIS — E782 Mixed hyperlipidemia: Secondary | ICD-10-CM | POA: Diagnosis not present

## 2021-04-30 DIAGNOSIS — Z720 Tobacco use: Secondary | ICD-10-CM | POA: Diagnosis not present

## 2021-04-30 DIAGNOSIS — Z136 Encounter for screening for cardiovascular disorders: Secondary | ICD-10-CM | POA: Diagnosis not present

## 2021-04-30 DIAGNOSIS — I110 Hypertensive heart disease with heart failure: Secondary | ICD-10-CM | POA: Diagnosis not present

## 2021-04-30 DIAGNOSIS — R7309 Other abnormal glucose: Secondary | ICD-10-CM | POA: Diagnosis not present

## 2021-04-30 DIAGNOSIS — I517 Cardiomegaly: Secondary | ICD-10-CM | POA: Diagnosis not present

## 2021-04-30 DIAGNOSIS — I10 Essential (primary) hypertension: Secondary | ICD-10-CM | POA: Diagnosis not present

## 2021-04-30 DIAGNOSIS — I509 Heart failure, unspecified: Secondary | ICD-10-CM | POA: Diagnosis not present

## 2021-04-30 DIAGNOSIS — J45909 Unspecified asthma, uncomplicated: Secondary | ICD-10-CM | POA: Diagnosis not present

## 2021-05-09 DIAGNOSIS — E7849 Other hyperlipidemia: Secondary | ICD-10-CM | POA: Diagnosis not present

## 2021-05-09 DIAGNOSIS — J449 Chronic obstructive pulmonary disease, unspecified: Secondary | ICD-10-CM | POA: Diagnosis not present

## 2021-05-09 DIAGNOSIS — G5693 Unspecified mononeuropathy of bilateral upper limbs: Secondary | ICD-10-CM | POA: Diagnosis not present

## 2021-05-09 DIAGNOSIS — I10 Essential (primary) hypertension: Secondary | ICD-10-CM | POA: Diagnosis not present

## 2021-09-26 DIAGNOSIS — R634 Abnormal weight loss: Secondary | ICD-10-CM | POA: Diagnosis not present

## 2021-09-26 DIAGNOSIS — F5104 Psychophysiologic insomnia: Secondary | ICD-10-CM | POA: Diagnosis not present

## 2021-09-26 DIAGNOSIS — F1010 Alcohol abuse, uncomplicated: Secondary | ICD-10-CM | POA: Diagnosis not present

## 2021-09-26 DIAGNOSIS — E782 Mixed hyperlipidemia: Secondary | ICD-10-CM | POA: Diagnosis not present

## 2021-09-26 DIAGNOSIS — I10 Essential (primary) hypertension: Secondary | ICD-10-CM | POA: Diagnosis not present

## 2021-09-26 DIAGNOSIS — J449 Chronic obstructive pulmonary disease, unspecified: Secondary | ICD-10-CM | POA: Diagnosis not present

## 2021-09-26 DIAGNOSIS — R7303 Prediabetes: Secondary | ICD-10-CM | POA: Diagnosis not present

## 2021-09-26 DIAGNOSIS — R413 Other amnesia: Secondary | ICD-10-CM | POA: Diagnosis not present

## 2021-10-11 DIAGNOSIS — F1010 Alcohol abuse, uncomplicated: Secondary | ICD-10-CM | POA: Diagnosis not present

## 2021-10-11 DIAGNOSIS — E782 Mixed hyperlipidemia: Secondary | ICD-10-CM | POA: Diagnosis not present

## 2021-10-11 DIAGNOSIS — Z0000 Encounter for general adult medical examination without abnormal findings: Secondary | ICD-10-CM | POA: Diagnosis not present

## 2021-10-11 DIAGNOSIS — R413 Other amnesia: Secondary | ICD-10-CM | POA: Diagnosis not present

## 2021-10-11 DIAGNOSIS — I10 Essential (primary) hypertension: Secondary | ICD-10-CM | POA: Diagnosis not present

## 2021-10-24 DIAGNOSIS — Z79899 Other long term (current) drug therapy: Secondary | ICD-10-CM | POA: Diagnosis not present

## 2021-10-24 DIAGNOSIS — Z800 Family history of malignant neoplasm of digestive organs: Secondary | ICD-10-CM | POA: Diagnosis not present

## 2021-10-24 DIAGNOSIS — Z1211 Encounter for screening for malignant neoplasm of colon: Secondary | ICD-10-CM | POA: Diagnosis not present

## 2021-10-30 DIAGNOSIS — Z1211 Encounter for screening for malignant neoplasm of colon: Secondary | ICD-10-CM | POA: Diagnosis not present

## 2021-11-14 DIAGNOSIS — I10 Essential (primary) hypertension: Secondary | ICD-10-CM | POA: Diagnosis not present

## 2021-11-14 DIAGNOSIS — R413 Other amnesia: Secondary | ICD-10-CM | POA: Diagnosis not present

## 2021-11-14 DIAGNOSIS — F331 Major depressive disorder, recurrent, moderate: Secondary | ICD-10-CM | POA: Diagnosis not present

## 2021-11-14 DIAGNOSIS — F1010 Alcohol abuse, uncomplicated: Secondary | ICD-10-CM | POA: Diagnosis not present

## 2021-11-14 DIAGNOSIS — J441 Chronic obstructive pulmonary disease with (acute) exacerbation: Secondary | ICD-10-CM | POA: Diagnosis not present

## 2021-11-15 ENCOUNTER — Other Ambulatory Visit: Payer: Self-pay | Admitting: Family Medicine

## 2021-11-15 DIAGNOSIS — R413 Other amnesia: Secondary | ICD-10-CM

## 2021-12-09 ENCOUNTER — Other Ambulatory Visit: Payer: Self-pay

## 2021-12-13 DIAGNOSIS — R413 Other amnesia: Secondary | ICD-10-CM | POA: Diagnosis not present

## 2021-12-13 DIAGNOSIS — I10 Essential (primary) hypertension: Secondary | ICD-10-CM | POA: Diagnosis not present

## 2021-12-13 DIAGNOSIS — E782 Mixed hyperlipidemia: Secondary | ICD-10-CM | POA: Diagnosis not present

## 2021-12-13 DIAGNOSIS — F1010 Alcohol abuse, uncomplicated: Secondary | ICD-10-CM | POA: Diagnosis not present

## 2021-12-14 ENCOUNTER — Other Ambulatory Visit: Payer: Self-pay

## 2022-01-01 ENCOUNTER — Inpatient Hospital Stay: Admission: RE | Admit: 2022-01-01 | Payer: Self-pay | Source: Ambulatory Visit

## 2022-01-02 ENCOUNTER — Other Ambulatory Visit: Payer: Self-pay | Admitting: Family Medicine

## 2022-01-02 DIAGNOSIS — R413 Other amnesia: Secondary | ICD-10-CM

## 2022-01-08 ENCOUNTER — Ambulatory Visit
Admission: RE | Admit: 2022-01-08 | Discharge: 2022-01-08 | Disposition: A | Discharge status: Home / Self Care | Payer: Medicare (Managed Care) | Source: Ambulatory Visit | Attending: Family Medicine | Admitting: Family Medicine

## 2022-01-08 DIAGNOSIS — R410 Disorientation, unspecified: Secondary | ICD-10-CM | POA: Diagnosis not present

## 2022-01-08 DIAGNOSIS — R413 Other amnesia: Secondary | ICD-10-CM

## 2022-01-08 MED ORDER — GADOBENATE DIMEGLUMINE 529 MG/ML IV SOLN
17.0000 mL | Freq: Once | INTRAVENOUS | Status: AC | PRN
  Administered 2022-01-08: 17 mL via INTRAVENOUS

## 2022-01-13 ENCOUNTER — Other Ambulatory Visit: Payer: Medicare (Managed Care)

## 2022-01-24 DIAGNOSIS — I10 Essential (primary) hypertension: Secondary | ICD-10-CM | POA: Diagnosis not present

## 2022-01-24 DIAGNOSIS — R413 Other amnesia: Secondary | ICD-10-CM | POA: Diagnosis not present

## 2022-01-24 DIAGNOSIS — R7303 Prediabetes: Secondary | ICD-10-CM | POA: Diagnosis not present

## 2022-01-24 DIAGNOSIS — F1010 Alcohol abuse, uncomplicated: Secondary | ICD-10-CM | POA: Diagnosis not present

## 2022-01-30 DIAGNOSIS — Z1231 Encounter for screening mammogram for malignant neoplasm of breast: Secondary | ICD-10-CM | POA: Diagnosis not present

## 2022-02-19 DIAGNOSIS — R928 Other abnormal and inconclusive findings on diagnostic imaging of breast: Secondary | ICD-10-CM | POA: Diagnosis not present

## 2022-02-19 DIAGNOSIS — N6489 Other specified disorders of breast: Secondary | ICD-10-CM | POA: Diagnosis not present

## 2022-02-19 DIAGNOSIS — R922 Inconclusive mammogram: Secondary | ICD-10-CM | POA: Diagnosis not present

## 2022-02-25 ENCOUNTER — Other Ambulatory Visit: Payer: Self-pay

## 2022-02-25 ENCOUNTER — Emergency Department (HOSPITAL_BASED_OUTPATIENT_CLINIC_OR_DEPARTMENT_OTHER)
Admission: EM | Admit: 2022-02-25 | Discharge: 2022-02-25 | Disposition: A | Discharge status: Home / Self Care | Payer: Medicare (Managed Care) | Attending: Emergency Medicine | Admitting: Emergency Medicine

## 2022-02-25 ENCOUNTER — Encounter (HOSPITAL_BASED_OUTPATIENT_CLINIC_OR_DEPARTMENT_OTHER): Payer: Self-pay | Admitting: Emergency Medicine

## 2022-02-25 DIAGNOSIS — I10 Essential (primary) hypertension: Secondary | ICD-10-CM | POA: Insufficient documentation

## 2022-02-25 DIAGNOSIS — H5789 Other specified disorders of eye and adnexa: Secondary | ICD-10-CM | POA: Diagnosis present

## 2022-02-25 DIAGNOSIS — J45909 Unspecified asthma, uncomplicated: Secondary | ICD-10-CM | POA: Insufficient documentation

## 2022-02-25 DIAGNOSIS — H1131 Conjunctival hemorrhage, right eye: Secondary | ICD-10-CM | POA: Diagnosis not present

## 2022-02-25 DIAGNOSIS — Z79899 Other long term (current) drug therapy: Secondary | ICD-10-CM | POA: Insufficient documentation

## 2022-02-25 DIAGNOSIS — Z7952 Long term (current) use of systemic steroids: Secondary | ICD-10-CM | POA: Insufficient documentation

## 2022-02-25 DIAGNOSIS — Z7951 Long term (current) use of inhaled steroids: Secondary | ICD-10-CM | POA: Insufficient documentation

## 2022-02-25 MED ORDER — CETIRIZINE HCL 10 MG PO TABS: 10.0000 mg | ORAL_TABLET | Freq: Every day | ORAL | 0 refills | Status: AC

## 2022-02-25 NOTE — ED Provider Notes (Addendum)
[facility] EMERGENCY DEPARTMENT Provider Note   CSN: [id number] Arrival date & time: 02/25/22  1914     History  Chief Complaint  Patient presents with   Eye Problem    Chelsea Garza is a 66 y.o. female with Hx of asthma, seasonal allergies, and hypertension presenting today with red discoloration of the right eye.  Right color present upon awakening.  Stated her eye is itchy yesterday and had rubbed it quite a bit.  Went to bed and the eye "looked fine".  No preceding blunt or penetrating injury.  Denies any pain, hemorrhage, or vision changes.  Denies fever, neck stiffness, or headache.  Mild watery discharge similar to her normal allergies, however states the appearance looks much different.  No other complaints at this time.  The history is provided by the patient and medical records.  Eye Problem Associated symptoms: redness (Blood red)       Home Medications Prior to Admission medications   Medication Sig Start Date End Date Taking? Authorizing Provider  cetirizine (ZYRTEC ALLERGY) 10 MG tablet Take 1 tablet (10 mg total) by mouth daily. 02/25/22  Yes Prince Rome, PA-C  albuterol (PROVENTIL HFA;VENTOLIN HFA) 108 (90 BASE) MCG/ACT inhaler Inhale 2 puffs into the lungs every 6 (six) hours as needed for wheezing.    [provider]  albuterol (PROVENTIL HFA;VENTOLIN HFA) 108 (90 Base) MCG/ACT inhaler Inhale 2 puffs into the lungs every 4 (four) hours as needed for wheezing or shortness of breath. 09/15/17   Orpah Greek, MD  amLODipine (NORVASC) 10 MG tablet  08/22/17   [provider]  budesonide-formoterol (SYMBICORT) 160-4.5 MCG/ACT inhaler Inhale 2 puffs into the lungs 2 (two) times daily.    [provider]  cyclobenzaprine (FLEXERIL) 10 MG tablet  09/10/17   [provider]  predniSONE (DELTASONE) 20 MG tablet Take 2 tablets (40 mg total) by mouth daily with breakfast. 09/15/17   Pollina, Gwenyth Allegra, MD       Allergies    Penicillins    Review of Systems   Review of Systems  Eyes:  Positive for redness (Blood red).    Physical Exam Updated Vital Signs BP (!) 154/105 (BP Location: Left Arm)   Pulse 72   Temp 98.8 F (37.1 C) (Oral)   Resp 18   Ht 5' 6.5" (1.689 m)   Wt 82.6 kg   SpO2 99%   BMI 28.94 kg/m  Physical Exam Vitals and nursing note reviewed.  Constitutional:      General: She is not in acute distress.    Appearance: Normal appearance. She is well-developed. She is not ill-appearing, toxic-appearing or diaphoretic.  HENT:     Head: Normocephalic and atraumatic.  Eyes:     General: Lids are normal. Vision grossly intact. Gaze aligned appropriately. No visual field deficit or scleral icterus.       Right eye: Discharge (Clear, watery) present. No foreign body or hordeolum.        Left eye: No foreign body, discharge or hordeolum.     Extraocular Movements: Extraocular movements intact.     Conjunctiva/sclera:     Right eye: Right conjunctiva is not injected. Hemorrhage present. No chemosis or exudate.    Left eye: Left conjunctiva is not injected. No chemosis, exudate or hemorrhage.    Pupils: Pupils are equal, round, and reactive to light.     Visual Fields: Right eye visual fields normal and left eye visual fields normal.  Comments: Right eye: Blood red discoloration of the conjunctiva with clear defined borders.  Does not appear raised.  Sudden appearance.  No evidence of blood in the iris or chamber.  No proptosis.  Lids appear normal.  No evidence of corneal ulceration.  Cardiovascular:     Rate and Rhythm: Normal rate and regular rhythm.     Pulses: Normal pulses.     Heart sounds: No murmur heard. Pulmonary:     Effort: Pulmonary effort is normal. No respiratory distress.     Breath sounds: Normal breath sounds.  Abdominal:     Palpations: Abdomen is soft.     Tenderness: There is no abdominal tenderness.  Musculoskeletal:        General: No swelling.      Cervical back: Neck supple.  Skin:    General: Skin is warm and dry.     Capillary Refill: Capillary refill takes less than 2 seconds.  Neurological:     Mental Status: She is alert and oriented to person, place, and time.  Psychiatric:        Mood and Affect: Mood normal.     ED Results / Procedures / Treatments   Labs (all labs ordered are listed, but only abnormal results are displayed) Labs Reviewed - No data to display  EKG None  Radiology No results found.  Procedures Procedures    Medications Ordered in ED Medications - No data to display  ED Course/ Medical Decision Making/ A&P                           Medical Decision Making Risk OTC drugs.   66 y.o. female presents to the ED for concern of Eye Problem   This involves an extensive number of treatment options, and is a complaint that carries with it a high risk of complications and morbidity.  The emergent differential diagnosis prior to evaluation includes, but is not limited to: Subconjunctival hemorrhage, hemorrhagic chemosis,Acute angle-closure glaucoma, hyphema, iritis  This is not an exhaustive differential.   Past Medical History / Co-morbidities / Social History: Hx of HTN, allergic rhinitis, asthma, seasonal allergies Social Determinants of Health include: Elderly  Additional History:  None  Lab Tests: None  Imaging Studies: None  ED Course: Pt well-appearing on exam.  Presenting today with redness of the conjunctiva of the right eye.  Noticed it upon awakening this morning.  Had been rubbing her eye last night due to pruritus.  Also with Hx of HTN and of advanced age.  Upon awakening, had noticed the blood red discoloration of the right eye.  Eye without any pain.  PERRLA.  Consensual reflex and EOMs normal.  Visual fields appear grossly intact.  Gaze aligned appropriately.  Mild clear watery discharge.  Exam overall unremarkable.  No visual changes.  Clinical diagnosis of  subconjunctival hemorrhage.  History, exam, and presentation provides low suspicion for corneal ulceration, acute angle-closure glaucoma, iritis, hyphema, hemorrhagic chemosis, retrobulbar hemorrhage, viral conjunctivitis, bacterial conjunctivitis, or globe rupture.  Recommend utilization of allergy eyedrops for pruritus, daily antihistamine such as Zyrtec, and close follow-up with ophthalmology.  Patient without ophthalmologist, resources provided.  Zyrtec sent to pharmacy.  Pt reports satisfaction with today's encounter.  Patient in NAD and in good condition at time of discharge.  Disposition: After consideration the patient's encounter today, I do not feel today's workup suggests an emergent condition requiring admission or immediate intervention beyond what has been performed at this  time.  Safe for discharge; instructed to return immediately for worsening symptoms, change in symptoms or any other concerns.  I have reviewed the patients home medicines and have made adjustments as needed.  Discussed course of treatment with the patient, whom demonstrated understanding.  Patient in agreement and has no further questions.     This chart was dictated using voice recognition software.  Despite best efforts to proofread, errors can occur which can change the documentation meaning.         Final Clinical Impression(s) / ED Diagnoses Final diagnoses:  Subconjunctival hemorrhage of right eye  Essential hypertension    Rx / DC Orders ED Discharge Orders          Ordered    cetirizine (ZYRTEC ALLERGY) 10 MG tablet  Daily        02/25/22 2228              Cecil Cobbs, PA-C 02/25/22 2233    Cecil Cobbs, PA-C 02/25/22 2234    Cecil Cobbs, PA-C 02/25/22 2238    Tegeler, Canary Brim, MD 02/25/22 [phone]

## 2022-02-25 NOTE — Discharge Instructions (Addendum)
You have been provided the contact information for a local ophthalmologist by the name of Dr. Ellie Lunch.  Please call to schedule a follow-up appointment within the next 2 days for reevaluation continue medical management.  Further information regarding subconjunctival hemorrhage has been provided for you.  Please review at your leisure.  You may also utilize allergy eyedrops from over-the-counter to help with itching, and take an over-the-counter antihistamine such as Zyrtec or Claritin.  A prescription for this has been sent to your pharmacy, you may take it daily.  Return to the ED for any new or worsening symptoms as discussed.

## 2022-02-25 NOTE — ED Triage Notes (Addendum)
Blood in sclarea of right eye that started today. Denies vision changes, known injury, pain. PERRLA. Endorses itchiness. Reports she had allergies which causes her eyes to itch, but this is different.

## 2022-02-26 ENCOUNTER — Other Ambulatory Visit: Payer: Self-pay

## 2022-02-26 ENCOUNTER — Emergency Department (HOSPITAL_BASED_OUTPATIENT_CLINIC_OR_DEPARTMENT_OTHER)
Admission: EM | Admit: 2022-02-26 | Discharge: 2022-02-26 | Disposition: A | Discharge status: Home / Self Care | Payer: Medicare (Managed Care) | Attending: Emergency Medicine | Admitting: Emergency Medicine

## 2022-02-26 ENCOUNTER — Encounter (HOSPITAL_BASED_OUTPATIENT_CLINIC_OR_DEPARTMENT_OTHER): Payer: Self-pay | Admitting: Pediatrics

## 2022-02-26 DIAGNOSIS — Z7951 Long term (current) use of inhaled steroids: Secondary | ICD-10-CM | POA: Diagnosis not present

## 2022-02-26 DIAGNOSIS — H1131 Conjunctival hemorrhage, right eye: Secondary | ICD-10-CM | POA: Insufficient documentation

## 2022-02-26 DIAGNOSIS — J45909 Unspecified asthma, uncomplicated: Secondary | ICD-10-CM | POA: Diagnosis not present

## 2022-02-26 DIAGNOSIS — Z79899 Other long term (current) drug therapy: Secondary | ICD-10-CM | POA: Insufficient documentation

## 2022-02-26 DIAGNOSIS — I10 Essential (primary) hypertension: Secondary | ICD-10-CM | POA: Insufficient documentation

## 2022-02-26 NOTE — Discharge Instructions (Addendum)
You were seen in the emergency department for follow up of your eye hemorrhage.  As we discussed, this looks stable and I believe it will take several days before it improves. Continue the allergy medicine you were prescribed.  I recommend follow up with the eye doctor if it persists. However, return to the ER if it worsens in any way including changes in your vision or eye pain.

## 2022-02-26 NOTE — ED Provider Notes (Signed)
[facility] EMERGENCY DEPARTMENT Provider Note   CSN: [id number] Arrival date & time: 02/26/22  1156     History  Chief Complaint  Patient presents with   Eye Problem    Chelsea Garza is a 66 y.o. female with history of asthma and hypertension who presents the emergency department complaining of continued eye irritation.  Patient states that she was seen in the ER yesterday for redness of her eye after several days of eye itching.  She has been trying some eyedrops.  She was discharged yesterday with diagnosis of subconjunctival hemorrhage and given an allergy medicine in the setting of likely allergic conjunctivitis.  States that while her symptoms have not worsened in any way, they have not improved.  She is unsure as to the healing timeline of this diagnosis.  Denies any eye pain or changes in her vision.   Eye Problem Associated symptoms: discharge, itching and redness   Associated symptoms: no photophobia        Home Medications Prior to Admission medications   Medication Sig Start Date End Date Taking? Authorizing Provider  albuterol (PROVENTIL HFA;VENTOLIN HFA) 108 (90 BASE) MCG/ACT inhaler Inhale 2 puffs into the lungs every 6 (six) hours as needed for wheezing.    [provider]  albuterol (PROVENTIL HFA;VENTOLIN HFA) 108 (90 Base) MCG/ACT inhaler Inhale 2 puffs into the lungs every 4 (four) hours as needed for wheezing or shortness of breath. 09/15/17   Orpah Greek, MD  amLODipine (NORVASC) 10 MG tablet  08/22/17   [provider]  budesonide-formoterol (SYMBICORT) 160-4.5 MCG/ACT inhaler Inhale 2 puffs into the lungs 2 (two) times daily.    [provider]  cetirizine (ZYRTEC ALLERGY) 10 MG tablet Take 1 tablet (10 mg total) by mouth daily. 09/16/79   Prince Rome, PA-C  cyclobenzaprine (FLEXERIL) 10 MG tablet  09/10/17   [provider]  predniSONE (DELTASONE) 20 MG tablet Take 2 tablets (40 mg total) by mouth  daily with breakfast. 09/15/17   Pollina, Gwenyth Allegra, MD      Allergies    Penicillins    Review of Systems   Review of Systems  Constitutional:  Negative for chills and fever.  Eyes:  Positive for discharge, redness and itching. Negative for photophobia, pain and visual disturbance.  All other systems reviewed and are negative.   Physical Exam Updated Vital Signs BP (!) 145/92 (BP Location: Left Arm)   Pulse 69   Temp 98.1 F (36.7 C) (Oral)   Resp 18   Ht 5' 6.5" (1.689 m)   Wt 82.6 kg   SpO2 98%   BMI 28.94 kg/m  Physical Exam Vitals and nursing note reviewed.  Constitutional:      Appearance: Normal appearance.  HENT:     Head: Normocephalic and atraumatic.  Eyes:     General: Lids are normal.     Conjunctiva/sclera:     Right eye: Right conjunctiva is injected. Hemorrhage present. No chemosis or exudate.    Left eye: Left conjunctiva is not injected. No chemosis, exudate or hemorrhage.    Pupils: Pupils are equal, round, and reactive to light.  Pulmonary:     Effort: Pulmonary effort is normal. No respiratory distress.  Skin:    General: Skin is warm and dry.  Neurological:     Mental Status: She is alert.  Psychiatric:        Mood and Affect: Mood normal.        Behavior: Behavior normal.  ED Results / Procedures / Treatments   Labs (all labs ordered are listed, but only abnormal results are displayed) Labs Reviewed - No data to display  EKG None  Radiology No results found.  Procedures Procedures    Medications Ordered in ED Medications - No data to display  ED Course/ Medical Decision Making/ A&P                           Medical Decision Making  Patient is a 66 year old female with a history of hypertension and asthma who presents the emergency department complaining of persistent eye irritation.  States that she started having some eye itching 2 days ago, and yesterday when she woke up she noticed significant redness of the right  eye.  Upon chart review, patient came to the ER for her symptoms yesterday.  Was diagnosed with subconjunctival hemorrhage, and recommended allergy eyedrops as well as daily antihistamine and ophthalmology follow-up.  Patient presents today stating that her symptoms have not worsened in any way, but they have not improved and she was concerned about this.  On my exam patient has what appears to be a stable conjunctival hemorrhage, without evidence of hyphema.  No lid changes, does not appear infected.  PERRLA and normal vision.  Discussed with the patient that I think it will take several days before her symptoms improve.  However, I am reassured by the fact that the eye is painless and her vision has not changed.  Suspect continued subconjunctival hemorrhage.  Low concern for corneal ulceration, deeper hemorrhage or globe rupture.  Will discharge with similar recommendations to prior visit.  Encouraged ophthalmology follow-up if her symptoms do not improve.  We discussed reasons to return to the emergency department, and she is agreeable to the plan.        Final Clinical Impression(s) / ED Diagnoses Final diagnoses:  Subconjunctival hemorrhage of right eye    Rx / DC Orders ED Discharge Orders     None      Portions of this report may have been transcribed using voice recognition software. Every effort was made to ensure accuracy; however, inadvertent computerized transcription errors may be present.    Jeanella Flattery 02/26/22 1415    Linwood Dibbles, MD 02/27/22 [phone]

## 2022-02-26 NOTE — ED Triage Notes (Signed)
Reported was seen yesterday for eye bilateral drainage and itching, here today for re check due to changes of symptoms before taking allergy medicine; stated right eye is reddened and no watery discharge and no itching compare to left eye; denies any vision changes;  no pain

## 2022-04-24 DIAGNOSIS — F1010 Alcohol abuse, uncomplicated: Secondary | ICD-10-CM | POA: Diagnosis not present

## 2022-04-24 DIAGNOSIS — E7849 Other hyperlipidemia: Secondary | ICD-10-CM | POA: Diagnosis not present

## 2022-04-24 DIAGNOSIS — T7840XS Allergy, unspecified, sequela: Secondary | ICD-10-CM | POA: Diagnosis not present

## 2022-04-24 DIAGNOSIS — J449 Chronic obstructive pulmonary disease, unspecified: Secondary | ICD-10-CM | POA: Diagnosis not present

## 2022-04-24 DIAGNOSIS — R7303 Prediabetes: Secondary | ICD-10-CM | POA: Diagnosis not present

## 2022-04-24 DIAGNOSIS — I10 Essential (primary) hypertension: Secondary | ICD-10-CM | POA: Diagnosis not present

## 2022-07-31 DIAGNOSIS — Z720 Tobacco use: Secondary | ICD-10-CM | POA: Diagnosis not present

## 2022-07-31 DIAGNOSIS — R7303 Prediabetes: Secondary | ICD-10-CM | POA: Diagnosis not present

## 2022-07-31 DIAGNOSIS — I10 Essential (primary) hypertension: Secondary | ICD-10-CM | POA: Diagnosis not present

## 2022-07-31 DIAGNOSIS — Z120 Encounter for screening for malignant neoplasm of stomach: Secondary | ICD-10-CM | POA: Diagnosis not present

## 2022-07-31 DIAGNOSIS — F1010 Alcohol abuse, uncomplicated: Secondary | ICD-10-CM | POA: Diagnosis not present

## 2022-07-31 DIAGNOSIS — M130 Polyarthritis, unspecified: Secondary | ICD-10-CM | POA: Diagnosis not present

## 2022-07-31 DIAGNOSIS — J399 Disease of upper respiratory tract, unspecified: Secondary | ICD-10-CM | POA: Diagnosis not present

## 2022-07-31 DIAGNOSIS — J441 Chronic obstructive pulmonary disease with (acute) exacerbation: Secondary | ICD-10-CM | POA: Diagnosis not present

## 2022-07-31 DIAGNOSIS — F331 Major depressive disorder, recurrent, moderate: Secondary | ICD-10-CM | POA: Diagnosis not present

## 2022-07-31 DIAGNOSIS — E559 Vitamin D deficiency, unspecified: Secondary | ICD-10-CM | POA: Diagnosis not present

## 2022-07-31 DIAGNOSIS — Z6828 Body mass index (BMI) 28.0-28.9, adult: Secondary | ICD-10-CM | POA: Diagnosis not present

## 2022-09-30 DIAGNOSIS — L2389 Allergic contact dermatitis due to other agents: Secondary | ICD-10-CM | POA: Diagnosis not present

## 2022-10-30 DIAGNOSIS — L2389 Allergic contact dermatitis due to other agents: Secondary | ICD-10-CM | POA: Diagnosis not present

## 2022-10-30 DIAGNOSIS — F331 Major depressive disorder, recurrent, moderate: Secondary | ICD-10-CM | POA: Diagnosis not present

## 2022-10-30 DIAGNOSIS — F1010 Alcohol abuse, uncomplicated: Secondary | ICD-10-CM | POA: Diagnosis not present

## 2022-10-30 DIAGNOSIS — I10 Essential (primary) hypertension: Secondary | ICD-10-CM | POA: Diagnosis not present

## 2022-10-30 DIAGNOSIS — R7303 Prediabetes: Secondary | ICD-10-CM | POA: Diagnosis not present

## 2023-02-05 DIAGNOSIS — E559 Vitamin D deficiency, unspecified: Secondary | ICD-10-CM | POA: Diagnosis not present

## 2023-02-05 DIAGNOSIS — J441 Chronic obstructive pulmonary disease with (acute) exacerbation: Secondary | ICD-10-CM | POA: Diagnosis not present

## 2023-02-05 DIAGNOSIS — F1011 Alcohol abuse, in remission: Secondary | ICD-10-CM | POA: Diagnosis not present

## 2023-02-05 DIAGNOSIS — Z6827 Body mass index (BMI) 27.0-27.9, adult: Secondary | ICD-10-CM | POA: Diagnosis not present

## 2023-02-05 DIAGNOSIS — Z720 Tobacco use: Secondary | ICD-10-CM | POA: Diagnosis not present

## 2023-02-05 DIAGNOSIS — R7303 Prediabetes: Secondary | ICD-10-CM | POA: Diagnosis not present

## 2023-02-05 DIAGNOSIS — I10 Essential (primary) hypertension: Secondary | ICD-10-CM | POA: Diagnosis not present

## 2023-02-05 DIAGNOSIS — R413 Other amnesia: Secondary | ICD-10-CM | POA: Diagnosis not present

## 2023-02-19 DIAGNOSIS — M25511 Pain in right shoulder: Secondary | ICD-10-CM | POA: Diagnosis not present

## 2023-02-19 DIAGNOSIS — S161XXA Strain of muscle, fascia and tendon at neck level, initial encounter: Secondary | ICD-10-CM | POA: Diagnosis not present

## 2023-02-19 DIAGNOSIS — M25512 Pain in left shoulder: Secondary | ICD-10-CM | POA: Diagnosis not present

## 2023-02-20 DIAGNOSIS — M62838 Other muscle spasm: Secondary | ICD-10-CM | POA: Diagnosis not present

## 2023-02-26 DIAGNOSIS — I10 Essential (primary) hypertension: Secondary | ICD-10-CM | POA: Diagnosis not present

## 2023-02-26 DIAGNOSIS — M62838 Other muscle spasm: Secondary | ICD-10-CM | POA: Diagnosis not present

## 2023-03-24 DIAGNOSIS — E7800 Pure hypercholesterolemia, unspecified: Secondary | ICD-10-CM | POA: Diagnosis not present

## 2023-03-24 DIAGNOSIS — F17210 Nicotine dependence, cigarettes, uncomplicated: Secondary | ICD-10-CM | POA: Diagnosis not present

## 2023-03-24 DIAGNOSIS — M130 Polyarthritis, unspecified: Secondary | ICD-10-CM | POA: Diagnosis not present

## 2023-03-24 DIAGNOSIS — F1010 Alcohol abuse, uncomplicated: Secondary | ICD-10-CM | POA: Diagnosis not present

## 2023-03-24 DIAGNOSIS — I10 Essential (primary) hypertension: Secondary | ICD-10-CM | POA: Diagnosis not present

## 2023-03-24 DIAGNOSIS — M542 Cervicalgia: Secondary | ICD-10-CM | POA: Diagnosis not present

## 2023-03-24 DIAGNOSIS — R634 Abnormal weight loss: Secondary | ICD-10-CM | POA: Diagnosis not present

## 2023-03-24 DIAGNOSIS — R7303 Prediabetes: Secondary | ICD-10-CM | POA: Diagnosis not present

## 2023-03-30 ENCOUNTER — Encounter (HOSPITAL_BASED_OUTPATIENT_CLINIC_OR_DEPARTMENT_OTHER): Payer: Self-pay | Admitting: Pediatrics

## 2023-03-30 ENCOUNTER — Other Ambulatory Visit: Payer: Self-pay

## 2023-03-30 ENCOUNTER — Emergency Department (HOSPITAL_BASED_OUTPATIENT_CLINIC_OR_DEPARTMENT_OTHER): Payer: Medicare (Managed Care)

## 2023-03-30 ENCOUNTER — Emergency Department (HOSPITAL_BASED_OUTPATIENT_CLINIC_OR_DEPARTMENT_OTHER)
Admission: EM | Admit: 2023-03-30 | Discharge: 2023-03-30 | Disposition: A | Discharge status: Home / Self Care | Payer: Medicare (Managed Care) | Attending: Emergency Medicine | Admitting: Emergency Medicine

## 2023-03-30 DIAGNOSIS — F1020 Alcohol dependence, uncomplicated: Secondary | ICD-10-CM | POA: Diagnosis not present

## 2023-03-30 DIAGNOSIS — R209 Unspecified disturbances of skin sensation: Secondary | ICD-10-CM | POA: Diagnosis not present

## 2023-03-30 DIAGNOSIS — J45909 Unspecified asthma, uncomplicated: Secondary | ICD-10-CM | POA: Diagnosis not present

## 2023-03-30 DIAGNOSIS — F1090 Alcohol use, unspecified, uncomplicated: Secondary | ICD-10-CM

## 2023-03-30 DIAGNOSIS — R413 Other amnesia: Secondary | ICD-10-CM | POA: Diagnosis not present

## 2023-03-30 DIAGNOSIS — Z7951 Long term (current) use of inhaled steroids: Secondary | ICD-10-CM | POA: Diagnosis not present

## 2023-03-30 DIAGNOSIS — E876 Hypokalemia: Secondary | ICD-10-CM | POA: Insufficient documentation

## 2023-03-30 DIAGNOSIS — I10 Essential (primary) hypertension: Secondary | ICD-10-CM | POA: Insufficient documentation

## 2023-03-30 DIAGNOSIS — Z79899 Other long term (current) drug therapy: Secondary | ICD-10-CM | POA: Diagnosis not present

## 2023-03-30 LAB — URINALYSIS, MICROSCOPIC (REFLEX): RBC / HPF: NONE SEEN RBC/hpf (ref 0–5)

## 2023-03-30 LAB — URINALYSIS, ROUTINE W REFLEX MICROSCOPIC
Bilirubin Urine: NEGATIVE
Glucose, UA: NEGATIVE mg/dL
Hgb urine dipstick: NEGATIVE
Ketones, ur: NEGATIVE mg/dL
Nitrite: NEGATIVE
Protein, ur: NEGATIVE mg/dL
Specific Gravity, Urine: 1.015 (ref 1.005–1.030)
pH: 7 (ref 5.0–8.0)

## 2023-03-30 LAB — CBC WITH DIFFERENTIAL/PLATELET
Abs Immature Granulocytes: 0.02 10*3/uL (ref 0.00–0.07)
Basophils Absolute: 0 10*3/uL (ref 0.0–0.1)
Basophils Relative: 1 %
Eosinophils Absolute: 0 10*3/uL (ref 0.0–0.5)
Eosinophils Relative: 0 %
HCT: 43.4 % (ref 36.0–46.0)
Hemoglobin: 14.5 g/dL (ref 12.0–15.0)
Immature Granulocytes: 0 %
Lymphocytes Relative: 22 %
Lymphs Abs: 1.8 10*3/uL (ref 0.7–4.0)
MCH: 29.5 pg (ref 26.0–34.0)
MCHC: 33.4 g/dL (ref 30.0–36.0)
MCV: 88.2 fL (ref 80.0–100.0)
Monocytes Absolute: 0.5 10*3/uL (ref 0.1–1.0)
Monocytes Relative: 6 %
Neutro Abs: 5.9 10*3/uL (ref 1.7–7.7)
Neutrophils Relative %: 71 %
Platelets: 199 10*3/uL (ref 150–400)
RBC: 4.92 MIL/uL (ref 3.87–5.11)
RDW: 12.1 % (ref 11.5–15.5)
WBC: 8.3 10*3/uL (ref 4.0–10.5)
nRBC: 0 % (ref 0.0–0.2)

## 2023-03-30 LAB — TSH: TSH: 0.603 u[IU]/mL (ref 0.350–4.500)

## 2023-03-30 LAB — COMPREHENSIVE METABOLIC PANEL
ALT: 19 U/L (ref 0–44)
AST: 27 U/L (ref 15–41)
Albumin: 4.2 g/dL (ref 3.5–5.0)
Alkaline Phosphatase: 51 U/L (ref 38–126)
Anion gap: 13 (ref 5–15)
BUN: 14 mg/dL (ref 8–23)
CO2: 26 mmol/L (ref 22–32)
Calcium: 9 mg/dL (ref 8.9–10.3)
Chloride: 99 mmol/L (ref 98–111)
Creatinine, Ser: 0.68 mg/dL (ref 0.44–1.00)
GFR, Estimated: >60 mL/min (ref >60)
Glucose, Bld: 131 mg/dL — ABNORMAL HIGH (ref 70–99)
Potassium: 2.9 mmol/L — ABNORMAL LOW (ref 3.5–5.1)
Sodium: 138 mmol/L (ref 135–145)
Total Bilirubin: 2.5 mg/dL — ABNORMAL HIGH (ref 0.3–1.2)
Total Protein: 7.6 g/dL (ref 6.5–8.1)

## 2023-03-30 LAB — T4, FREE: Free T4: 0.8 ng/dL (ref 0.61–1.12)

## 2023-03-30 LAB — MAGNESIUM: Magnesium: 1.8 mg/dL (ref 1.7–2.4)

## 2023-03-30 LAB — FOLATE: Folate: 15 ng/mL (ref >5.9)

## 2023-03-30 LAB — VITAMIN B12: Vitamin B-12: 373 pg/mL (ref 180–914)

## 2023-03-30 MED ORDER — THIAMINE HCL 100 MG/ML IJ SOLN
500.0000 mg | Freq: Once | INTRAVENOUS | Status: DC
  Filled 2023-03-30: qty 5

## 2023-03-30 MED ORDER — POTASSIUM CHLORIDE CRYS ER 20 MEQ PO TBCR
40.0000 meq | EXTENDED_RELEASE_TABLET | Freq: Once | ORAL | Status: AC
Start: 2023-03-30 — End: 2023-03-30
  Administered 2023-03-30: 40 meq via ORAL
  Filled 2023-03-30: qty 2

## 2023-03-30 MED ORDER — POTASSIUM CHLORIDE CRYS ER 20 MEQ PO TBCR: 20.0000 meq | EXTENDED_RELEASE_TABLET | Freq: Every day | ORAL | 0 refills | Status: AC

## 2023-03-30 MED ORDER — MAGNESIUM OXIDE -MG SUPPLEMENT 400 (240 MG) MG PO TABS
400.0000 mg | ORAL_TABLET | Freq: Once | ORAL | Status: AC
  Administered 2023-03-30: 400 mg via ORAL
  Filled 2023-03-30: qty 1

## 2023-03-30 MED ORDER — THIAMINE MONONITRATE 100 MG PO TABS
100.0000 mg | ORAL_TABLET | Freq: Once | ORAL | Status: AC
  Administered 2023-03-30: 100 mg via ORAL
  Filled 2023-03-30: qty 1

## 2023-03-30 MED ORDER — THIAMINE HCL 100 MG PO TABS: 100.0000 mg | ORAL_TABLET | Freq: Every day | ORAL | 0 refills | Status: AC

## 2023-03-30 NOTE — ED Notes (Signed)
Pt ambulatory from triage 

## 2023-03-30 NOTE — ED Notes (Signed)
D/c paperwork reviewed with pt, including prescriptions and follow up care.  All questions and/or concerns addressed at time of d/c.  No further needs expressed. . Pt verbalized understanding, Ambulatory with family to ED exit, NAD.   

## 2023-03-30 NOTE — ED Provider Notes (Signed)
[facility] Provider Note   CSN: [id number] Arrival date & time: 03/30/23  1349     History  Chief Complaint  Patient presents with   Memory Loss    Chelsea Garza is a 67 y.o. female with PMH as listed below who presents with memory loss. Presents with her husband and daughter who provide additional history. Patient has had worsening memory x 1 year, worse over the last 3 months. She is forgetful of things such as where her belongings are, especially her phone which she now just leaves at home, but also things such as who is in the car with her or who her husband is. No h/o similar.  She has never had any stroke/MI that she is aware of. Does not take a blood thinner. Did fall and hit her head 3 weeks ago d/t imbalance while walking on the stairs, did not lose consciousness and does not have any pain as a result of that fall. She states she wasn't drinking alcohol at the time of the fall, and she struggles with balance a lot especially on the stairs even when she is totally sober. She drinks EtOH daily for years, as much as a bottle of wine per day. She also endorses bilateral feet tingling and numb sensation for several months, especially at night. Denies asymmetric weakness, numbness/tingling, slurred speech, facial droop. She denies double vision, blurry vision, vertigo, dizziness, chest pain, SOB, wheezing, abdominal pain, N/V/D/C, urinary sxs, urinary incontinence. Patient and her family deny any SI/HI/AH/VH, no trouble seeing/hearing things that aren't there, but does forget things such as that her brother was never in the car with her today. She does not have a psychiatric history.    Past Medical History:  Diagnosis Date   Asthma    Hypertension        Home Medications Prior to Admission medications   Medication Sig Start Date End Date Taking? Authorizing Provider  potassium chloride SA (KLOR-CON M) 20 MEQ tablet Take 1 tablet (20 mEq  total) by mouth daily for 3 days. 03/30/23 04/02/23 Yes Loetta Rough, MD  thiamine (VITAMIN B1) 100 MG tablet Take 1 tablet (100 mg total) by mouth daily. 03/30/23 04/29/23 Yes Loetta Rough, MD  albuterol (PROVENTIL HFA;VENTOLIN HFA) 108 (90 BASE) MCG/ACT inhaler Inhale 2 puffs into the lungs every 6 (six) hours as needed for wheezing.    [provider]  albuterol (PROVENTIL HFA;VENTOLIN HFA) 108 (90 Base) MCG/ACT inhaler Inhale 2 puffs into the lungs every 4 (four) hours as needed for wheezing or shortness of breath. 09/15/17   Gilda Crease, MD  amLODipine (NORVASC) 10 MG tablet  08/22/17   [provider]  budesonide-formoterol (SYMBICORT) 160-4.5 MCG/ACT inhaler Inhale 2 puffs into the lungs 2 (two) times daily.    [provider]  cetirizine (ZYRTEC ALLERGY) 10 MG tablet Take 1 tablet (10 mg total) by mouth daily. 02/25/22   Cecil Cobbs, PA-C  cyclobenzaprine (FLEXERIL) 10 MG tablet  09/10/17   [provider]  predniSONE (DELTASONE) 20 MG tablet Take 2 tablets (40 mg total) by mouth daily with breakfast. 09/15/17   Pollina, Canary Brim, MD      Allergies    Penicillins    Review of Systems   Review of Systems A 10 point review of systems was performed and is negative unless otherwise reported in HPI.  Physical Exam Updated Vital Signs BP (!) 144/92   Pulse 73  Temp 97.7 F (36.5 C)   Resp 18   Ht 5\' 7"  (1.702 m)   Wt 68.5 kg   SpO2 100%   BMI 23.65 kg/m  Physical Exam General: Normal appearing female, lying in bed.  HEENT: PERRLA, EOMI, no nystagmus, Sclera anicteric, MMM, trachea midline. No facial droop. Tongue protrudes mildine.  Cardiology: RRR, no murmurs/rubs/gallops. BL radial and DP pulses equal bilaterally.  Resp: Normal respiratory rate and effort. CTAB, no wheezes, rhonchi, crackles.  Abd: Soft, non-tender, non-distended. No rebound tenderness or guarding.  GU: Deferred. MSK: Mild nonpitting edema symmetric  bilateral LEs. No signs of trauma. Extremities without deformity or TTP. No cyanosis or clubbing. Skin: warm, dry.  Neuro: A&Ox4, CNs II-XII grossly intact. 5/5 strength all extremities. Sensation grossly intact. Normal speech. Intact repetition. No ataxia in upper/lower extremities.  Psych: Normal mood and affect.    ED Results / Procedures / Treatments   Labs (all labs ordered are listed, but only abnormal results are displayed) Labs Reviewed  COMPREHENSIVE METABOLIC PANEL - Abnormal; Notable for the following components:      Result Value   Potassium 2.9 (*)    Glucose, Bld 131 (*)    Total Bilirubin 2.5 (*)    All other components within normal limits  URINALYSIS, ROUTINE W REFLEX MICROSCOPIC - Abnormal; Notable for the following components:   Leukocytes,Ua SMALL (*)    All other components within normal limits  URINALYSIS, MICROSCOPIC (REFLEX) - Abnormal; Notable for the following components:   Bacteria, UA RARE (*)    All other components within normal limits  CBC WITH DIFFERENTIAL/PLATELET  MAGNESIUM  TSH  T4, FREE  VITAMIN B1  FOLATE  VITAMIN B12  VITAMIN B1  VITAMIN B12  FOLATE    EKG EKG Interpretation Date/Time:  Monday March 30 2023 15:53:38 EDT Ventricular Rate:  74 PR Interval:  189 QRS Duration:  129 QT Interval:  442 QTC Calculation: 491 R Axis:   62  Text Interpretation: Sinus or ectopic atrial rhythm Consider right atrial enlargement IVCD, consider atypical RBBB Probable left ventricular hypertrophy Anterior Q waves, possibly due to LVH U waves present Confirmed by Vivi Barrack [phone]) on 03/30/2023 4:06:46 PM  Radiology CT Head Wo Contrast  Result Date: 03/30/2023 CLINICAL DATA:  Memory loss worsening over the last 3 weeks. EXAM: CT HEAD WITHOUT CONTRAST TECHNIQUE: Contiguous axial images were obtained from the base of the skull through the vertex without intravenous contrast. RADIATION DOSE REDUCTION: This exam was performed according to the  departmental dose-optimization program which includes automated exposure control, adjustment of the mA and/or kV according to patient size and/or use of iterative reconstruction technique. COMPARISON:  None Available. FINDINGS: Brain: There is no evidence for acute hemorrhage, hydrocephalus, mass lesion, or abnormal extra-axial fluid collection. No definite CT evidence for acute infarction. Vascular: No hyperdense vessel or unexpected calcification. Skull: No evidence for fracture. No worrisome lytic or sclerotic lesion. Sinuses/Orbits: Chronic mucosal disease noted both maxillary sinuses. Visualized portions of the globes and intraorbital fat are unremarkable. Other: None. IMPRESSION: 1. No acute intracranial abnormality. 2. Chronic bilateral maxillary sinusitis. Electronically Signed   By: Kennith Center M.D.   On: 03/30/2023 19:17    Procedures Procedures    Medications Ordered in ED Medications  potassium chloride SA (KLOR-CON M) CR tablet 40 mEq (40 mEq Oral Given 03/30/23 1639)  magnesium oxide (MAG-OX) tablet 400 mg (400 mg Oral Given 03/30/23 1639)  thiamine (VITAMIN B1) tablet 100 mg (100 mg Oral Given 03/30/23 1639)  ED Course/ Medical Decision Making/ A&P                          Medical Decision Making Amount and/or Complexity of Data Reviewed Labs: ordered. Decision-making details documented in ED Course. Radiology: ordered. Decision-making details documented in ED Course.  Risk OTC drugs. Prescription drug management.    This patient presents to the ED for concern of memory loss, chronic alcohol use, fall; this involves an extensive number of treatment options, and is a complaint that carries with it a high risk of complications and morbidity.  I considered the following differential and admission for this acute, potentially life threatening condition.   MDM:    Ddx of acute altered mental status/memory trouble considered but not limited to:  Patient with subacute to  chronic memory loss over the last year, worsening in the last 3 months.  She did have a recent head trauma and will rule out ICH or hydrocephalus with a CT head.  She has not had any infectious symptoms or fever to suggest UTI or pneumonia.  No neck pain headache or fever to indicate meningitis and she is overall very well-appearing today.  She denies any drug use but endorses significant alcohol use. 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 Given her report of balance issues even when she is not drinking I am concerned she may be at risk for thiamine deficiency or Wernicke encephalopathy.  She does not have any nystagmus on exam but will discuss with a neurologist.  EKG does not demonstrate signs of arrhythmia however she is mild to moderately hypokalemic at 2.9, likely due to poor nutrition and has flattened T waves in U waves present. She is able to take PO and this is repleted. Also consider B12 deficiency given EtOH intake, neuropathy, but she does not have anemia that would be characteristic.  Will replete potassium as well as magnesium orally.  Consider endocrine abnormality as well.  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  She also could have the beginnings of vascular or Alzheimer's dementia.  She has not had any focal neurologic deficits to indicate acute CVA.  Clinical Course as of 03/30/23 1926  Mon Mar 30, 2023  1615 Potassium(!): 2.9 [HN]  1615 Magnesium: 1.8 [HN]  1615 CBC, UA unremarkable [HN]  1616 Total Bilirubin(!): 2.5 Mildly elevated T bili but no elevated AST/ALT or alk phos [HN]  1620 Consulted to neurology. [HN]  1629 D/w Dr. Amada Jupiter. 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 Encouraged still to send a thiamine level and other labs, as well as to supplement patient's thiamine daily, but can follow  up with neurology as outpatient.  [HN]  1923 TSH: 0.603 wnl [HN]  1923 CT Head Wo Contrast 1. No acute intracranial abnormality. 2. Chronic bilateral maxillary sinusitis.   [HN]  1923 Not symptomatic currently from sinusitis.  Patient with reassuring head CT and labs.  She has had thiamine, potassium as well as magnesium repleted.  Her B1, B12, and folate levels are still pending.  Patient [HN]  1924 is prescribed thiamine 100 mg p.o. daily as well as potassium 20 mill equivalents for 3 days.  She is referred to neurology as an outpatient for evaluation for chronic memory loss and lower extremity paresthesias.  Will follow her lab values on MyChart. Instructed  to f/u with PCP as well as with neurology. DC w/ discharge instructions/return precautions. All questions answered to patient's satisfaction.   [HN]    Clinical Course User Index [HN] Loetta Rough, MD    Labs: I Ordered, and personally interpreted labs.  The pertinent results include: Those listed above  Imaging Studies ordered: I ordered imaging studies including CT head I independently visualized and interpreted imaging. I agree with the radiologist interpretation  Additional history obtained from chart review, family at bedside.    Cardiac Monitoring: The patient was maintained on a cardiac monitor.  I personally viewed and interpreted the cardiac monitored which showed an underlying rhythm of: Normal sinus rhythm  Reevaluation: After the interventions noted above, I reevaluated the patient and found that they have :stayed the same  Social Determinants of Health:  lives independently  Disposition:  DC  Co morbidities that complicate the patient evaluation  Past Medical History:  Diagnosis Date   Asthma    Hypertension      Medicines Meds ordered this encounter  Medications   potassium chloride SA (KLOR-CON M) CR tablet 40 mEq   magnesium oxide (MAG-OX) tablet 400 mg   DISCONTD: thiamine (VITAMIN B1) 500 mg  in sodium chloride 0.9 % 50 mL IVPB   thiamine (VITAMIN B1) tablet 100 mg   thiamine (VITAMIN B1) 100 MG tablet    Sig: Take 1 tablet (100 mg total) by mouth daily.    Dispense:  30 tablet    Refill:  0   potassium chloride SA (KLOR-CON M) 20 MEQ tablet    Sig: Take 1 tablet (20 mEq total) by mouth daily for 3 days.    Dispense:  3 tablet    Refill:  0    I have reviewed the patients home medicines and have made adjustments as needed  Problem List / ED Course: Problem List Items Addressed This Visit   None Visit Diagnoses     Memory loss of unknown cause    -  Primary   Relevant Orders   Ambulatory referral to Neurology   Hypokalemia       Chronic alcohol use                       This note was created using dictation software, which may contain spelling or grammatical errors.    Loetta Rough, MD 03/30/23 [phone]

## 2023-03-30 NOTE — ED Triage Notes (Signed)
C/O memory loss over the past year, worst the last three weeks.

## 2023-03-30 NOTE — Discharge Instructions (Addendum)
Thank you for coming to [facility] Emergency Department. You were seen for memory loss. We did an exam, labs, and imaging, and these showed low potassium. Please take thiamine (vitamin B1) supplement 100 mg once per day. Please also take potassium supplement 20 mEq once per day for 3 days. Please follow up your B1, B12, and folate labs on MyChart.  It is possible that your symptoms of memory loss as well as your tingling in your feet could be due to your alcohol consumption.  Please decrease and stop your alcohol consumption.  If you begin to experience symptoms of alcohol withdrawal including tremors, nausea vomiting, headache, sweating, anxiety, hallucinations, or seizures, please immediately call 911 or come to the emergency department.  Do not hesitate to return to the ED or call 911 if you experience: -Worsening symptoms -Asymmetric numbness, tingling, weakness -Sudden visual changes including double vision or blurry vision -Severe headache -Slurred speech, facial droop -Lightheadedness, passing out -Fevers/chills -Anything else that concerns you

## 2023-03-30 NOTE — ED Notes (Signed)
Pt transported to CT ?

## 2023-04-03 LAB — VITAMIN B1: Vitamin B1 (Thiamine): 90.6 nmol/L (ref 66.5–200.0)

## 2023-05-05 DIAGNOSIS — I10 Essential (primary) hypertension: Secondary | ICD-10-CM | POA: Diagnosis not present

## 2023-06-18 DIAGNOSIS — I10 Essential (primary) hypertension: Secondary | ICD-10-CM | POA: Diagnosis not present

## 2023-06-18 DIAGNOSIS — R413 Other amnesia: Secondary | ICD-10-CM | POA: Diagnosis not present

## 2023-06-18 DIAGNOSIS — E876 Hypokalemia: Secondary | ICD-10-CM | POA: Diagnosis not present

## 2023-06-18 DIAGNOSIS — E870 Hyperosmolality and hypernatremia: Secondary | ICD-10-CM | POA: Diagnosis not present

## 2023-07-21 ENCOUNTER — Ambulatory Visit: Payer: Medicare Other | Admitting: Neurology

## 2023-07-23 DIAGNOSIS — E782 Mixed hyperlipidemia: Secondary | ICD-10-CM | POA: Diagnosis not present

## 2023-07-23 DIAGNOSIS — I10 Essential (primary) hypertension: Secondary | ICD-10-CM | POA: Diagnosis not present

## 2023-08-04 DIAGNOSIS — R413 Other amnesia: Secondary | ICD-10-CM | POA: Diagnosis not present

## 2023-08-04 DIAGNOSIS — I10 Essential (primary) hypertension: Secondary | ICD-10-CM | POA: Diagnosis not present

## 2023-08-31 DIAGNOSIS — M9903 Segmental and somatic dysfunction of lumbar region: Secondary | ICD-10-CM | POA: Diagnosis not present

## 2023-08-31 DIAGNOSIS — M9901 Segmental and somatic dysfunction of cervical region: Secondary | ICD-10-CM | POA: Diagnosis not present

## 2023-08-31 DIAGNOSIS — M47812 Spondylosis without myelopathy or radiculopathy, cervical region: Secondary | ICD-10-CM | POA: Diagnosis not present

## 2023-08-31 DIAGNOSIS — M9907 Segmental and somatic dysfunction of upper extremity: Secondary | ICD-10-CM | POA: Diagnosis not present

## 2023-08-31 DIAGNOSIS — M47816 Spondylosis without myelopathy or radiculopathy, lumbar region: Secondary | ICD-10-CM | POA: Diagnosis not present

## 2023-08-31 DIAGNOSIS — R293 Abnormal posture: Secondary | ICD-10-CM | POA: Diagnosis not present

## 2023-08-31 DIAGNOSIS — M25511 Pain in right shoulder: Secondary | ICD-10-CM | POA: Diagnosis not present

## 2023-09-03 DIAGNOSIS — I10 Essential (primary) hypertension: Secondary | ICD-10-CM | POA: Diagnosis not present

## 2023-10-08 DIAGNOSIS — R7303 Prediabetes: Secondary | ICD-10-CM | POA: Diagnosis not present

## 2023-10-08 DIAGNOSIS — I10 Essential (primary) hypertension: Secondary | ICD-10-CM | POA: Diagnosis not present

## 2023-10-14 DIAGNOSIS — M9901 Segmental and somatic dysfunction of cervical region: Secondary | ICD-10-CM | POA: Diagnosis not present

## 2023-10-14 DIAGNOSIS — M47812 Spondylosis without myelopathy or radiculopathy, cervical region: Secondary | ICD-10-CM | POA: Diagnosis not present

## 2023-10-14 DIAGNOSIS — M9903 Segmental and somatic dysfunction of lumbar region: Secondary | ICD-10-CM | POA: Diagnosis not present

## 2023-10-14 DIAGNOSIS — M25511 Pain in right shoulder: Secondary | ICD-10-CM | POA: Diagnosis not present

## 2023-10-14 DIAGNOSIS — R293 Abnormal posture: Secondary | ICD-10-CM | POA: Diagnosis not present

## 2023-10-14 DIAGNOSIS — M9907 Segmental and somatic dysfunction of upper extremity: Secondary | ICD-10-CM | POA: Diagnosis not present

## 2023-10-14 DIAGNOSIS — M47816 Spondylosis without myelopathy or radiculopathy, lumbar region: Secondary | ICD-10-CM | POA: Diagnosis not present

## 2023-10-16 DIAGNOSIS — M9903 Segmental and somatic dysfunction of lumbar region: Secondary | ICD-10-CM | POA: Diagnosis not present

## 2023-10-16 DIAGNOSIS — R293 Abnormal posture: Secondary | ICD-10-CM | POA: Diagnosis not present

## 2023-10-16 DIAGNOSIS — M9901 Segmental and somatic dysfunction of cervical region: Secondary | ICD-10-CM | POA: Diagnosis not present

## 2023-10-16 DIAGNOSIS — M47816 Spondylosis without myelopathy or radiculopathy, lumbar region: Secondary | ICD-10-CM | POA: Diagnosis not present

## 2023-10-16 DIAGNOSIS — M47812 Spondylosis without myelopathy or radiculopathy, cervical region: Secondary | ICD-10-CM | POA: Diagnosis not present

## 2023-10-16 DIAGNOSIS — M9907 Segmental and somatic dysfunction of upper extremity: Secondary | ICD-10-CM | POA: Diagnosis not present

## 2023-10-16 DIAGNOSIS — M25511 Pain in right shoulder: Secondary | ICD-10-CM | POA: Diagnosis not present

## 2023-10-21 DIAGNOSIS — M9901 Segmental and somatic dysfunction of cervical region: Secondary | ICD-10-CM | POA: Diagnosis not present

## 2023-10-21 DIAGNOSIS — M9903 Segmental and somatic dysfunction of lumbar region: Secondary | ICD-10-CM | POA: Diagnosis not present

## 2023-10-21 DIAGNOSIS — M47816 Spondylosis without myelopathy or radiculopathy, lumbar region: Secondary | ICD-10-CM | POA: Diagnosis not present

## 2023-10-21 DIAGNOSIS — M9907 Segmental and somatic dysfunction of upper extremity: Secondary | ICD-10-CM | POA: Diagnosis not present

## 2023-10-21 DIAGNOSIS — M25511 Pain in right shoulder: Secondary | ICD-10-CM | POA: Diagnosis not present

## 2023-12-10 DIAGNOSIS — E7849 Other hyperlipidemia: Secondary | ICD-10-CM | POA: Diagnosis not present

## 2023-12-10 DIAGNOSIS — I10 Essential (primary) hypertension: Secondary | ICD-10-CM | POA: Diagnosis not present

## 2023-12-10 DIAGNOSIS — E7800 Pure hypercholesterolemia, unspecified: Secondary | ICD-10-CM | POA: Diagnosis not present

## 2023-12-10 DIAGNOSIS — R7303 Prediabetes: Secondary | ICD-10-CM | POA: Diagnosis not present

## 2023-12-15 DIAGNOSIS — Z1231 Encounter for screening mammogram for malignant neoplasm of breast: Secondary | ICD-10-CM | POA: Diagnosis not present

## 2024-01-28 DIAGNOSIS — I10 Essential (primary) hypertension: Secondary | ICD-10-CM | POA: Diagnosis not present

## 2024-01-28 DIAGNOSIS — W19XXXA Unspecified fall, initial encounter: Secondary | ICD-10-CM | POA: Diagnosis not present

## 2024-01-28 DIAGNOSIS — R413 Other amnesia: Secondary | ICD-10-CM | POA: Diagnosis not present

## 2024-01-28 DIAGNOSIS — R200 Anesthesia of skin: Secondary | ICD-10-CM | POA: Diagnosis not present

## 2024-01-28 DIAGNOSIS — E782 Mixed hyperlipidemia: Secondary | ICD-10-CM | POA: Diagnosis not present

## 2024-01-28 DIAGNOSIS — M549 Dorsalgia, unspecified: Secondary | ICD-10-CM | POA: Diagnosis not present
